# Patient Record
Sex: Female | Born: 1985
Health system: Southern US, Community
[De-identification: ages and names within clinical notes are randomized; demographics above are authoritative.]

## PROBLEM LIST (undated history)

## (undated) DIAGNOSIS — E119 Type 2 diabetes mellitus without complications: Secondary | ICD-10-CM

## (undated) DIAGNOSIS — F909 Attention-deficit hyperactivity disorder, unspecified type: Secondary | ICD-10-CM

## (undated) DIAGNOSIS — G4700 Insomnia, unspecified: Secondary | ICD-10-CM

## (undated) DIAGNOSIS — I10 Essential (primary) hypertension: Secondary | ICD-10-CM

## (undated) DIAGNOSIS — F419 Anxiety disorder, unspecified: Secondary | ICD-10-CM

## (undated) DIAGNOSIS — R519 Headache, unspecified: Secondary | ICD-10-CM

## (undated) DIAGNOSIS — F32A Depression, unspecified: Secondary | ICD-10-CM

## (undated) DIAGNOSIS — F319 Bipolar disorder, unspecified: Secondary | ICD-10-CM

## (undated) DIAGNOSIS — F4310 Post-traumatic stress disorder, unspecified: Secondary | ICD-10-CM

## (undated) HISTORY — DX: Type 2 diabetes mellitus without complications: E11.9

## (undated) HISTORY — DX: Headache, unspecified: R51.9

## (undated) HISTORY — DX: Post-traumatic stress disorder, unspecified: F43.10

## (undated) HISTORY — DX: Bipolar disorder, unspecified: F31.9

---

## 2019-09-04 HISTORY — PX: TONSILLECTOMY: SUR1361

## 2020-03-15 DIAGNOSIS — Z03818 Encounter for observation for suspected exposure to other biological agents ruled out: Secondary | ICD-10-CM | POA: Diagnosis not present

## 2020-03-15 DIAGNOSIS — Z20822 Contact with and (suspected) exposure to covid-19: Secondary | ICD-10-CM | POA: Diagnosis not present

## 2020-03-26 DIAGNOSIS — Z20822 Contact with and (suspected) exposure to covid-19: Secondary | ICD-10-CM | POA: Diagnosis not present

## 2020-03-31 ENCOUNTER — Encounter (HOSPITAL_BASED_OUTPATIENT_CLINIC_OR_DEPARTMENT_OTHER): Payer: Self-pay

## 2020-03-31 ENCOUNTER — Other Ambulatory Visit: Payer: Self-pay

## 2020-03-31 DIAGNOSIS — R05 Cough: Secondary | ICD-10-CM | POA: Diagnosis not present

## 2020-03-31 DIAGNOSIS — I10 Essential (primary) hypertension: Secondary | ICD-10-CM | POA: Diagnosis not present

## 2020-03-31 DIAGNOSIS — J020 Streptococcal pharyngitis: Secondary | ICD-10-CM | POA: Diagnosis not present

## 2020-03-31 DIAGNOSIS — J039 Acute tonsillitis, unspecified: Secondary | ICD-10-CM | POA: Insufficient documentation

## 2020-03-31 NOTE — ED Triage Notes (Signed)
Pt c/o flu like sx x 1 week-neg covid test 7/24-did receive covid vaccine-NAD-steady gait

## 2020-04-01 ENCOUNTER — Emergency Department (HOSPITAL_BASED_OUTPATIENT_CLINIC_OR_DEPARTMENT_OTHER)
Admission: EM | Admit: 2020-04-01 | Discharge: 2020-04-01 | Disposition: A | Discharge status: Home / Self Care | Payer: BC Managed Care – PPO | Attending: Emergency Medicine | Admitting: Emergency Medicine

## 2020-04-01 DIAGNOSIS — J020 Streptococcal pharyngitis: Secondary | ICD-10-CM

## 2020-04-01 DIAGNOSIS — J039 Acute tonsillitis, unspecified: Secondary | ICD-10-CM

## 2020-04-01 HISTORY — DX: Attention-deficit hyperactivity disorder, unspecified type: F90.9

## 2020-04-01 HISTORY — DX: Insomnia, unspecified: G47.00

## 2020-04-01 HISTORY — DX: Essential (primary) hypertension: I10

## 2020-04-01 HISTORY — DX: Depression, unspecified: F32.A

## 2020-04-01 HISTORY — DX: Anxiety disorder, unspecified: F41.9

## 2020-04-01 LAB — GROUP A STREP BY PCR: Group A Strep by PCR: DETECTED — AB

## 2020-04-01 MED ORDER — PENICILLIN G BENZATHINE 1200000 UNIT/2ML IM SUSP
1.2000 10*6.[IU] | Freq: Once | INTRAMUSCULAR | Status: AC
  Administered 2020-04-01: 1.2 10*6.[IU] via INTRAMUSCULAR
  Filled 2020-04-01: qty 2

## 2020-04-01 MED ORDER — DEXAMETHASONE 6 MG PO TABS
10.0000 mg | ORAL_TABLET | Freq: Once | ORAL | Status: AC
  Administered 2020-04-01: 10 mg via ORAL
  Filled 2020-04-01: qty 1

## 2020-04-01 NOTE — ED Provider Notes (Addendum)
[facility] Provider Note: Savannah Dell, MD, FACEP  CSN: [id number] MRN: [id number] ARRIVAL: 03/31/20 at 2235 ROOM: MHOTF/OTF   CHIEF COMPLAINT  Sore Throat   HISTORY OF PRESENT ILLNESS  04/01/20 1:21 AM Theo Reither is a 34 y.o. female with a 1 week history of sore throat and enlarged tonsils.  She rates her associated pain is a 4 out of 10, worse with swallowing.  She tested negative for Covid 03/26/2020 and has received the Covid vaccine series.  She has had an occasional cough.  She is not aware of having a fever.   Past Medical History:  Diagnosis Date  . ADHD   . Anxiety   . Depression   . Hypertension   . Insomnia     History reviewed. No pertinent surgical history.  No family history on file.  Social History   Tobacco Use  . Smoking status: Never Smoker  . Smokeless tobacco: Never Used  Vaping Use  . Vaping Use: Never used  Substance Use Topics  . Alcohol use: Yes    Comment: weekly  . Drug use: Yes    Types: Marijuana    Prior to Admission medications   Not on File    Allergies Patient has no known allergies.   REVIEW OF SYSTEMS  Negative except as noted here or in the History of Present Illness.   PHYSICAL EXAMINATION  Initial Vital Signs Blood pressure 123/75, pulse 90, temperature 98.6 F (37 C), temperature source Oral, resp. rate 18, height 5\' 1"  (1.549 m), weight (!) 98 kg, SpO2 98 %.  Examination General: Well-developed, well-nourished female in no acute distress; appearance consistent with age of record HENT: normocephalic; atraumatic; enlarged tonsils with exudate; uvula midline; no trismus Eyes: pupils equal, round and reactive to light; extraocular muscles intact Neck: supple; anterior cervical lymphadenopathy Heart: regular rate and rhythm Lungs: clear to auscultation bilaterally Abdomen: soft; nondistended; nontender; bowel sounds present Extremities: No deformity; full range of motion; pulses  normal Neurologic: Awake, alert and oriented; motor function intact in all extremities and symmetric; no facial droop Skin: Warm and dry Psychiatric: Normal mood and affect   RESULTS  Summary of this visit's results, reviewed and interpreted by myself:   EKG Interpretation  Date/Time:    Ventricular Rate:    PR Interval:    QRS Duration:   QT Interval:    QTC Calculation:   R Axis:     Text Interpretation:        Laboratory Studies: Results for orders placed or performed during the hospital encounter of 04/01/20 (from the past 24 hour(s))  Group A Strep by PCR     Status: Abnormal   Collection Time: 04/01/20  1:21 AM   Specimen: Throat; Sterile Swab  Result Value Ref Range   Group A Strep by PCR DETECTED (A) NOT DETECTED   Imaging Studies: No results found.  ED COURSE and MDM  Nursing notes, initial and subsequent vitals signs, including pulse oximetry, reviewed and interpreted by myself.  Vitals:   03/31/20 2243 04/01/20 0134  BP: 123/75 109/74  Pulse: 90 82  Resp: 18 14  Temp: 98.6 F (37 C) 98 F (36.7 C)  TempSrc: Oral Oral  SpO2: 98% 99%  Weight: (!) 98 kg   Height: 5\' 1"  (1.549 m)    Medications  penicillin g benzathine (BICILLIN LA) 1200000 UNIT/2ML injection 1.2 Million Units (1.2 Million Units Intramuscular Given 04/01/20 0140)  dexamethasone (DECADRON) tablet 10 mg (10 mg Oral Given  04/01/20 0142)   1:33 AM Patient would like to be treated for tonsillitis whether strep is positive or negative and I am in agreement with this plan.  Her tonsils are significantly enlarged with exudate.  We will also give a dose of dexamethasone.   PROCEDURES  Procedures   ED DIAGNOSES     ICD-10-CM   1. Tonsillitis  J03.90   2. Strep pharyngitis  J02.0        Irelynn Schermerhorn, MD 04/01/20 0134    Paula Libra, MD 04/01/20 0109

## 2020-04-08 DIAGNOSIS — E1169 Type 2 diabetes mellitus with other specified complication: Secondary | ICD-10-CM | POA: Diagnosis not present

## 2020-04-08 DIAGNOSIS — J351 Hypertrophy of tonsils: Secondary | ICD-10-CM | POA: Diagnosis not present

## 2020-04-21 DIAGNOSIS — J358 Other chronic diseases of tonsils and adenoids: Secondary | ICD-10-CM | POA: Diagnosis not present

## 2020-04-29 DIAGNOSIS — J358 Other chronic diseases of tonsils and adenoids: Secondary | ICD-10-CM | POA: Diagnosis not present

## 2020-04-29 DIAGNOSIS — J351 Hypertrophy of tonsils: Secondary | ICD-10-CM | POA: Diagnosis not present

## 2020-05-11 DIAGNOSIS — Z20822 Contact with and (suspected) exposure to covid-19: Secondary | ICD-10-CM | POA: Diagnosis not present

## 2020-12-06 ENCOUNTER — Other Ambulatory Visit: Payer: Self-pay

## 2020-12-06 ENCOUNTER — Emergency Department (HOSPITAL_BASED_OUTPATIENT_CLINIC_OR_DEPARTMENT_OTHER): Payer: No Typology Code available for payment source

## 2020-12-06 ENCOUNTER — Encounter (HOSPITAL_BASED_OUTPATIENT_CLINIC_OR_DEPARTMENT_OTHER): Payer: Self-pay | Admitting: *Deleted

## 2020-12-06 ENCOUNTER — Emergency Department (HOSPITAL_BASED_OUTPATIENT_CLINIC_OR_DEPARTMENT_OTHER)
Admission: EM | Admit: 2020-12-06 | Discharge: 2020-12-07 | Disposition: A | Discharge status: Home / Self Care | Payer: No Typology Code available for payment source | Attending: Emergency Medicine | Admitting: Emergency Medicine

## 2020-12-06 DIAGNOSIS — I10 Essential (primary) hypertension: Secondary | ICD-10-CM | POA: Insufficient documentation

## 2020-12-06 DIAGNOSIS — K5900 Constipation, unspecified: Secondary | ICD-10-CM | POA: Diagnosis not present

## 2020-12-06 DIAGNOSIS — Z79899 Other long term (current) drug therapy: Secondary | ICD-10-CM | POA: Insufficient documentation

## 2020-12-06 LAB — URINALYSIS, ROUTINE W REFLEX MICROSCOPIC
Bilirubin Urine: NEGATIVE
Glucose, UA: NEGATIVE mg/dL
Hgb urine dipstick: NEGATIVE
Ketones, ur: NEGATIVE mg/dL
Leukocytes,Ua: NEGATIVE
Nitrite: NEGATIVE
Protein, ur: NEGATIVE mg/dL
Specific Gravity, Urine: 1.02 (ref 1.005–1.030)
pH: 6.5 (ref 5.0–8.0)

## 2020-12-06 LAB — COMPREHENSIVE METABOLIC PANEL
ALT: 11 U/L (ref 0–44)
AST: 13 U/L — ABNORMAL LOW (ref 15–41)
Albumin: 3.9 g/dL (ref 3.5–5.0)
Alkaline Phosphatase: 72 U/L (ref 38–126)
Anion gap: 9 (ref 5–15)
BUN: 14 mg/dL (ref 6–20)
CO2: 25 mmol/L (ref 22–32)
Calcium: 8.7 mg/dL — ABNORMAL LOW (ref 8.9–10.3)
Chloride: 101 mmol/L (ref 98–111)
Creatinine, Ser: 1.02 mg/dL — ABNORMAL HIGH (ref 0.44–1.00)
GFR, Estimated: >60 mL/min (ref >60)
Glucose, Bld: 107 mg/dL — ABNORMAL HIGH (ref 70–99)
Potassium: 3.1 mmol/L — ABNORMAL LOW (ref 3.5–5.1)
Sodium: 135 mmol/L (ref 135–145)
Total Bilirubin: 0.3 mg/dL (ref 0.3–1.2)
Total Protein: 7.6 g/dL (ref 6.5–8.1)

## 2020-12-06 LAB — CBC
HCT: 37.7 % (ref 36.0–46.0)
Hemoglobin: 12.1 g/dL (ref 12.0–15.0)
MCH: 27.4 pg (ref 26.0–34.0)
MCHC: 32.1 g/dL (ref 30.0–36.0)
MCV: 85.3 fL (ref 80.0–100.0)
Platelets: 437 10*3/uL — ABNORMAL HIGH (ref 150–400)
RBC: 4.42 MIL/uL (ref 3.87–5.11)
RDW: 16.1 % — ABNORMAL HIGH (ref 11.5–15.5)
WBC: 9.9 10*3/uL (ref 4.0–10.5)
nRBC: 0 % (ref 0.0–0.2)

## 2020-12-06 LAB — PREGNANCY, URINE: Preg Test, Ur: NEGATIVE

## 2020-12-06 MED ORDER — BISACODYL 10 MG RE SUPP
10.0000 mg | Freq: Once | RECTAL | Status: AC
  Administered 2020-12-06: 10 mg via RECTAL
  Filled 2020-12-06: qty 1

## 2020-12-06 NOTE — ED Triage Notes (Signed)
No BM in 3 days since she started taking Zofran. She has tried increasing her fiber intake with no relief.

## 2020-12-06 NOTE — ED Provider Notes (Signed)
[facility] EMERGENCY DEPARTMENT Provider Note   CSN: [id number] Arrival date & time: 12/06/20  2043   History Chief Complaint  Patient presents with  . Constipation    Savannah [name] is a 35 y.o. female.  The history is provided by the patient.  Constipation She has history of hypertension, anxiety, depression and comes in because of constipation.  She states she has not had a bowel movement for the last 4 days.  She has had some abdominal cramping with that.  Abdominal pain has been as severe as 7/10.  There has been [name] nausea or vomiting.  She had taken ondansetron prior to getting constipated, but denies any other medication changes.  She has tried eating some fruits and taking a probiotic, with [name] relief.  Past Medical History:  Diagnosis Date  . ADHD   . Anxiety   . Depression   . Hypertension   . Insomnia     There are [name] problems to display for this patient.   History reviewed. [name] pertinent surgical history.   OB History   [name] obstetric history on file.     [name] family history on file.  Social History   Tobacco Use  . Smoking status: Never Smoker  . Smokeless tobacco: Never Used  Vaping Use  . Vaping Use: Never used  Substance Use Topics  . Alcohol use: Yes    Comment: weekly  . Drug use: Yes    Types: Marijuana    Home Medications Prior to Admission medications   Medication Sig Start Date End Date Taking? Authorizing Provider  amphetamine-dextroamphetamine (ADDERALL XR) 20 MG 24 hr capsule 10 mg. 03/04/20  Yes [provider]  Dulaglutide (TRULICITY) 0.75 MG/0.5ML SOPN See admin instructions. 04/26/20  Yes [provider]  escitalopram (LEXAPRO) 20 MG tablet 1 tablet   Yes [provider]  levonorgestrel (MIRENA, 52 MG,) 20 MCG/24HR IUD    Yes [provider]  lisinopril-hydrochlorothiazide (ZESTORETIC) 20-12.5 MG tablet 1 tablet 05/25/19  Yes [provider]  rizatriptan (MAXALT-MLT) 10 MG  disintegrating tablet 1 tablet 07/17/19  Yes [provider]  topiramate (TOPAMAX) 25 MG tablet 1 tablet 10/22/19  Yes [provider]  ALPRAZolam Prudy Feeler) 0.5 MG tablet 1 tablet 03/30/19   [provider]  Eszopiclone 3 MG TABS 1 tablet immediately before bedtime 03/30/19   [provider]  traZODone (DESYREL) 50 MG tablet Take by mouth.    [provider]    Allergies    Patient has [name] known allergies.  Review of Systems   Review of Systems  Gastrointestinal: Positive for constipation.  All other systems reviewed and are negative.   Physical Exam Updated Vital Signs BP 102/61 (BP Location: Right Arm)   Pulse 74   Temp 98.6 F (37 C) (Oral)   Resp 18   Ht 5\' 1"  (1.549 m)   Wt 89.5 kg   SpO2 100%   BMI 37.28 kg/m   Physical Exam Vitals and nursing note reviewed.   35 year old female, resting comfortably and in [name] acute distress. Vital signs are normal. Oxygen saturation is 100%, which is normal. Head is normocephalic and atraumatic. PERRLA, EOMI. Oropharynx is clear. Neck is nontender and supple without adenopathy or JVD. Back is nontender and there is [name] CVA tenderness. Lungs are clear without rales, wheezes, or rhonchi. Chest is nontender. Heart has regular rate and rhythm without murmur. Abdomen is soft, flat, nontender without masses or hepatosplenomegaly and peristalsis is normoactive. Rectal:  Normal sphincter tone, [name] impaction.  Small amount of light brown stool present. Extremities have [name] cyanosis or edema, full range of motion is present. Skin is warm and dry without rash. Neurologic: Mental status is normal, cranial nerves are intact, there are [name] motor or sensory deficits.  ED Results / Procedures / Treatments   Labs (all labs ordered are listed, but only abnormal results are displayed) Labs Reviewed  CBC - Abnormal; Notable for the following components:      Result Value   RDW 16.1 (*)    Platelets 437 (*)     All other components within normal limits  COMPREHENSIVE METABOLIC PANEL - Abnormal; Notable for the following components:   Potassium 3.1 (*)    Glucose, Bld 107 (*)    Creatinine, Ser 1.02 (*)    Calcium 8.7 (*)    AST 13 (*)    All other components within normal limits  URINALYSIS, ROUTINE W REFLEX MICROSCOPIC  PREGNANCY, URINE   Radiology [name] results found.  Procedures Procedures   Medications Ordered in ED Medications  magnesium citrate solution 1 Bottle (has [name] administration in time range)  potassium chloride SA (KLOR-CON) CR tablet 40 mEq (has [name] administration in time range)  bisacodyl (DULCOLAX) suppository 10 mg (10 mg Rectal Given 12/06/20 2344)    ED Course  I have reviewed the triage vital signs and the nursing notes.  Pertinent labs & imaging results that were available during my care of the patient were reviewed by me and considered in my medical decision making (see chart for details).  MDM Rules/Calculators/A&P Constipation without evidence of fecal impaction.  Old records reviewed, and she has [name] relevant past visits.  Exam is benign.  We will try bisacodyl suppository.  Labs show mild thrombocytosis, hypokalemia.  X-ray shows nonspecific bowel gas pattern.  She is given a dose of oral potassium.  Patient did not have a bowel movement following bisacodyl suppository.  She is given a dose of magnesium citrate and discharged.  Final Clinical Impression(s) / ED Diagnoses Final diagnoses:  Constipation, unspecified constipation type    Rx / DC Orders ED Discharge Orders    None       Dione Booze, MD 12/07/20 [id number]

## 2020-12-06 NOTE — ED Triage Notes (Signed)
Emergency Medicine Provider Triage Evaluation Note  Savannah [name] , a 35 y.o. female  was evaluated in triage.  Pt complains of constipation. No BM since 31st. Abdominal pain that began last night. No Rectal bleeding or anal pain. No history of IBS.  No abdominal surgeries. Feels bloated and full. No urinary symptoms.  Tried fiber.  Review of Systems  Positive: Constipation, abdominal pain  Negative: Fevers, bloody stool, anal pain  Physical Exam  BP 106/71 (BP Location: Right Arm)   Pulse (!) 103   Temp 98.6 F (37 C) (Oral)   Resp 16   Ht 5\' 1"  (1.549 m)   Wt 89.5 kg   SpO2 100%   BMI 37.28 kg/m  Gen:   Awake, no distress  HEENT:  Atraumatic  Resp:  Normal effort  Cardiac:  Normal rate  Abd:   Nondistended, nontender MSK:   Moves extremities without difficulty Neuro:  Speech clear   Medical Decision Making  Medically screening exam initiated at 9:30 PM.  Appropriate orders placed.  Savannah [name] was informed that the remainder of the evaluation will be completed by another provider, this initial triage assessment does not replace that evaluation, and the importance of remaining in the ED until their evaluation is complete.  Clinical Impression  Constipation with mild abdominal discomfort, no TTP on exam.   [name], PA-C 12/06/20 2136

## 2020-12-07 MED ORDER — MAGNESIUM CITRATE PO SOLN
1.0000 | Freq: Once | ORAL | Status: AC
  Administered 2020-12-07: 1 via ORAL
  Filled 2020-12-07: qty 296

## 2020-12-07 MED ORDER — POTASSIUM CHLORIDE CRYS ER 20 MEQ PO TBCR
40.0000 meq | EXTENDED_RELEASE_TABLET | Freq: Once | ORAL | Status: AC
  Administered 2020-12-07: 40 meq via ORAL
  Filled 2020-12-07: qty 2

## 2020-12-07 NOTE — Discharge Instructions (Signed)
Try to stay on a high-fiber diet.  It is acceptable to take polyethylene glycol (MiraLAX) as needed for constipation.  MiraLAX can be safely taken every day, if needed.  For times when you need a laxative, it is safe to use bisacodyl on an occasional basis.

## 2020-12-09 ENCOUNTER — Ambulatory Visit (HOSPITAL_COMMUNITY)
Admission: EM | Admit: 2020-12-09 | Discharge: 2020-12-09 | Disposition: A | Discharge status: Home / Self Care | Payer: 59

## 2020-12-09 ENCOUNTER — Other Ambulatory Visit: Payer: Self-pay

## 2020-12-09 DIAGNOSIS — F3130 Bipolar disorder, current episode depressed, mild or moderate severity, unspecified: Secondary | ICD-10-CM

## 2020-12-09 NOTE — ED Provider Notes (Signed)
Behavioral Health Urgent Care Medical Screening Exam  Patient Name: Savannah [name] MRN: [id number] Date of Evaluation: 12/09/20 Chief Complaint:  Crying spells Diagnosis:  Final diagnoses:  Bipolar I disorder, most recent episode depressed (HCC)    History of Present illness: Savannah [name] is a 35 y.o. female with past psychiatric diagnosis of major depressive disorder, generalized anxiety disorder and informed the provider that she has been recently diagnosed with bipolar in February 2020.   She denies any suicidal or homicidal ideations today.  She denies any auditory or visual hallucinations.  She states that [facility] but was told this morning that she has an outstanding bill~1000 $ there and until she settles that they will not be able to see her.  Apparently, they do not have her insurance information and were charging her as a cash patient.  She states she was started on Lamictal in February 2020 for mood stabilization but she does not feel well from last 2 weeks.  She is having crying spells, getting stuck at her work, decreased motivation for work but is afraid about losing her job.  She was taking Lexapro from 2016 to February 2022 and is being tapered off Lexapro and only taking 10 mg daily.  She states she needs help with outpatient psychiatrist and therapist as she would not be able [facility].  She mentioned about her past suicide attempt in 2018 and she was admitted in inpatient psychiatric hospital, [place].  She did PHP after that was life-changing for her and she plans to do outpatient therapy again. 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  Psychiatric Specialty Exam  Presentation  General Appearance:Well Groomed  Eye Contact:Good  Speech:Pressured  Speech  Volume:Normal  Handedness:Right   Mood and Affect  Mood:Anxious  Affect:Tearful   Thought Process  Thought Processes:Coherent  Descriptions of Associations:Intact  Orientation:Full (Time, Place and Person)  Thought Content:Abstract Reasoning    Hallucinations:None  Ideas of Reference:None  Suicidal Thoughts:No  Homicidal Thoughts:No   Sensorium  Memory:Immediate Fair; Recent Fair; Remote Fair  Judgment:Good  Insight:Good   Executive Functions  Concentration:Good  Attention Span:Good  Recall:Fair  Fund of Knowledge:Good  Language:Good   Psychomotor Activity  Psychomotor Activity:Normal   Assets  Assets:Communication Skills; Desire for Improvement; Financial Resources/Insurance; Housing; Resilience; Social Support; Talents/Skills; Transportation   Sleep  Sleep:Fair  Number of hours: 5   Nutritional Assessment (For OBS and FBC admissions only) Has the patient had a weight loss or gain of 10 pounds or more in the last 3 months?: No Has the patient had a decrease in food intake/or appetite?: Yes Does the patient have dental problems?: No Does the patient have eating habits or behaviors that may be indicators of an eating disorder including binging or inducing vomiting?: No Has the patient recently lost weight without trying?: No Has the patient been eating poorly because of a decreased appetite?: Yes Malnutrition Screening Tool Score: 1    Physical Exam: Physical Exam Vitals and nursing note reviewed.  Constitutional:      Appearance: She is obese.  HENT:     Head: Normocephalic and atraumatic.     Nose: Nose normal.     Mouth/Throat:     Mouth: Mucous membranes are moist.  Cardiovascular:     Rate and Rhythm: Normal rate and regular rhythm.  Pulses: Normal pulses.  Pulmonary:     Effort: Pulmonary effort is normal.  Skin:    General: Skin is warm.  Neurological:     Mental Status: She is alert and oriented to person, place, and  time.    Review of Systems  Constitutional: Negative.   HENT: Negative.   Eyes: Negative.   Respiratory: Negative.   Cardiovascular: Negative.   Gastrointestinal: Negative.   Genitourinary: Negative.   Musculoskeletal: Negative.   Skin: Negative.    Blood pressure 112/70, pulse 77, temperature 98 F (36.7 C), temperature source Oral, resp. rate 18, SpO2 100 %. There is no height or weight on file to calculate BMI.  Musculoskeletal: Strength & Muscle Tone: within normal limits Gait & Station: normal Patient leans: N/A   [facility] MSE Discharge Disposition for Follow up and Recommendations: Based on my evaluation the patient does not appear to have an emergency medical condition and can be discharged with resources and follow up care in outpatient services for Medication Management and Individual Therapy --Patient is provided with appropriate resources to schedule appointment for outpatient psychiatrist and therapist.  Initially patient showed good understanding about it but later on was tearful as she was feeling overwhelmed about apprehension of making these appointments.  She is still not suicidal, homicidal, psychotic or manic and does not meet criteria for inpatient psychiatric hospitalization. --Patient is explained in detail that she should be calling and making an appointment for her for outpatient services for continuity of her treatment.  Arnoldo Lenis, MD 12/09/2020, 1:10 PM  PGY-1, Resident

## 2020-12-09 NOTE — [facility] Counselor (Signed)
TTS triage: Patient presents to [facility] reporting concerns over current medications. She is followed by the [facility] and was started on lamictdal 14 weeks ago. 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 She is concerned about losing her job. She is tearful as she speaks about this. She went to [facility] today to see if she could move up her next appointment, but they did not have her insurance on file and said she had a $1,000 bill and would not see her.   Patient is routine.

## 2020-12-09 NOTE — ED Notes (Signed)
Pt discharged with resources in hand. Verbalized understanding of discharge instructions. Safety maintained. 

## 2020-12-09 NOTE — Discharge Instructions (Addendum)

## 2021-01-15 ENCOUNTER — Ambulatory Visit (HOSPITAL_COMMUNITY)
Admission: EM | Admit: 2021-01-15 | Discharge: 2021-01-15 | Disposition: A | Discharge status: Home / Self Care | Payer: 59

## 2021-01-15 ENCOUNTER — Other Ambulatory Visit: Payer: Self-pay

## 2021-01-15 DIAGNOSIS — F320 Major depressive disorder, single episode, mild: Secondary | ICD-10-CM

## 2021-01-15 NOTE — ED Notes (Signed)
Pt discharged with recommendations for follow up care. Safety maintained.

## 2021-01-15 NOTE — Discharge Instructions (Signed)
Take all medications as prescribed. Keep all follow-up appointments as scheduled.  Do not consume alcohol or use illegal drugs while on prescription medications. Report any adverse effects from your medications to your primary care provider promptly.  In the event of recurrent symptoms or worsening symptoms, call 911, a crisis hotline, or go to the nearest emergency department for evaluation.   

## 2021-01-15 NOTE — Progress Notes (Signed)
Patient presents as a walk-in to the [facility] stating that she feels like she has lost her "T-Code."  She states that she needs to be inside of someone to let it all out.  Patientstates that she feels like she needs a full psychiatric evaluation.  Patient states that she was ADHD as a child and now as an adult, she states that he has been being treated for bipolar disorder.  She states that she has been going to the [facility] and they have been treating her with mood stabilizers, but she states that she does not believe that this was the right kind of medication.  Patient states that she believes that she has autism and aspergers.  Patient states that he has sleep disturbance and she has been experiencing nightmares and she states that she has a lot of anxiety.  She denies any current SI/HI/Psychosis.  Patient states that she has been suicidal in the past and states that she was last hospitalized for this in [place] in 2018.  Patient admits to daily marijuana use.  Patient is agreeable to participate in IOP at [facility].  Patient was seen by [name], Np and arrangements were made for her to start the program this week.

## 2021-01-15 NOTE — ED Provider Notes (Addendum)
Behavioral Health Urgent Care Medical Screening Exam  Patient Name: Savannah [name] MRN: [id number] Date of Evaluation: 01/15/21 Chief Complaint:   Diagnosis:  Final diagnoses:  None    Savannah [name]  reports " I feel like I have unlocked my cheat codes."    History of Present illness: Savannah [name] is a 35 y.o. female presents to [facility] urgent care seeking a full evaluation of her mental health.  Stated "I think I may have been on the autistic spectrum/Asperger".  Reports [facility] with a diagnosis of [facility] disorder.  States she was prescribed  a mood stabilizer which made her depression worse.    States she was diagnosed with ADHD during her childhood.  Current diagnoses major depressive disorder, anxiety and insomnia.  States she is recently changing providers to [facility].  Reported suicide attempt in 2018 where she completed a partial hospitalization program in [place].  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   Deziah denied suicidal or homicidal ideations.  Denies auditory or visual hallucinations.  Reports she is currently prescribed Xanax, Adderall and Lexapro.  States she continues to have nightmares related to " bad calls" reports conversations ruminate in her mind.  Discussed following up with partial hospitalization programming.  Support, encouragement and reassurance was provided.  Psychiatric Specialty Exam  Presentation  General Appearance:Appropriate for Environment  Eye Contact:Good  Speech:Clear and Coherent  Speech Volume:Normal  Handedness:Right   Mood and Affect  Mood:Anxious  Affect:Congruent   Thought Process  Thought Processes:Coherent  Descriptions of Associations:Intact  Orientation:Full (Time, Place and Person)  Thought Content:Logical    Hallucinations:None  Ideas of Reference:None  Suicidal Thoughts:No  Homicidal Thoughts:No   Sensorium   Memory:Immediate Fair; Recent Fair; Remote Fair  Judgment:Fair  Insight:Fair   Executive Functions  Concentration:Fair  Attention Span:Fair  Recall:Fair  Fund of Knowledge:Good  Language:Good   Psychomotor Activity  Psychomotor Activity:Normal   Assets  Assets:Desire for Improvement; Social Support   Sleep  Sleep:Poor  Number of hours: 5   Nutritional Assessment (For OBS and FBC admissions only) Has the patient had a weight loss or gain of 10 pounds or more in the last 3 months?: No Has the patient had a decrease in food intake/or appetite?: No Does the patient have dental problems?: No Does the patient have eating habits or behaviors that may be indicators of an eating disorder including binging or inducing vomiting?: No Has the patient recently lost weight without trying?: No Has the patient been eating poorly because of a decreased appetite?: No Malnutrition Screening Tool Score: 0    Physical Exam: Physical Exam Vitals and nursing note reviewed.  Cardiovascular:     Rate and Rhythm: Normal rate and regular rhythm.  Neurological:     Mental Status: She is alert.  Psychiatric:        Attention and Perception: Attention normal.        Mood and Affect: Mood normal.        Speech: Speech normal.        Behavior: Behavior normal.        Thought Content: Thought content normal.        Cognition and Memory: Cognition normal.        Judgment: Judgment normal.    Review of Systems  Psychiatric/Behavioral: Positive for depression. Negative for hallucinations and suicidal ideas. Substance abuse: reported THC use. The patient is nervous/anxious.   All other systems reviewed  and are negative.  There were no vitals taken for this visit. There is no height or weight on file to calculate BMI.  Musculoskeletal: Strength & Muscle Tone: within normal limits Gait & Station: normal Patient leans: N/A   [facility] MSE Discharge Disposition for Follow up and  Recommendations: Based on my evaluation the patient does not appear to have an emergency medical condition and can be discharged with resources and follow up care in outpatient services for Medication Management, Individual Therapy and Group Therapy - PHP follow-up  [name], NP 01/15/2021, 10:20 AM

## 2021-01-16 ENCOUNTER — Telehealth (HOSPITAL_COMMUNITY): Payer: Self-pay | Admitting: Licensed Clinical Social Worker

## 2021-01-17 ENCOUNTER — Other Ambulatory Visit: Payer: Self-pay

## 2021-01-17 ENCOUNTER — Ambulatory Visit (HOSPITAL_COMMUNITY): Payer: 59 | Admitting: Licensed Clinical Social Worker

## 2021-01-17 DIAGNOSIS — F3181 Bipolar II disorder: Secondary | ICD-10-CM

## 2021-01-18 ENCOUNTER — Other Ambulatory Visit (HOSPITAL_COMMUNITY): Payer: 59 | Attending: Family | Admitting: Licensed Clinical Social Worker

## 2021-01-18 ENCOUNTER — Encounter (HOSPITAL_COMMUNITY): Payer: Self-pay | Admitting: Family

## 2021-01-18 ENCOUNTER — Other Ambulatory Visit (HOSPITAL_COMMUNITY): Payer: 59 | Admitting: Occupational Therapy

## 2021-01-18 ENCOUNTER — Encounter (HOSPITAL_COMMUNITY): Payer: Self-pay

## 2021-01-18 ENCOUNTER — Other Ambulatory Visit: Payer: Self-pay

## 2021-01-18 DIAGNOSIS — R4589 Other symptoms and signs involving emotional state: Secondary | ICD-10-CM

## 2021-01-18 DIAGNOSIS — R41844 Frontal lobe and executive function deficit: Secondary | ICD-10-CM

## 2021-01-18 DIAGNOSIS — F3181 Bipolar II disorder: Secondary | ICD-10-CM | POA: Diagnosis not present

## 2021-01-18 DIAGNOSIS — Z79899 Other long term (current) drug therapy: Secondary | ICD-10-CM | POA: Insufficient documentation

## 2021-01-18 DIAGNOSIS — F331 Major depressive disorder, recurrent, moderate: Secondary | ICD-10-CM

## 2021-01-18 MED ORDER — QUETIAPINE FUMARATE 25 MG PO TABS: 25.0000 mg | ORAL_TABLET | Freq: Two times a day (BID) | ORAL | 0 refills | Status: DC

## 2021-01-18 MED ORDER — QUETIAPINE FUMARATE 100 MG PO TABS: 100.0000 mg | ORAL_TABLET | Freq: Every day | ORAL | 0 refills | Status: DC

## 2021-01-18 MED ORDER — PRAZOSIN HCL 1 MG PO CAPS: 1.0000 mg | ORAL_CAPSULE | Freq: Every day | ORAL | 0 refills | Status: DC

## 2021-01-18 NOTE — Psych (Signed)
Virtual Visit via Video Note  I connected with Savannah [name] on 01/18/21 at  9:00 AM EDT by a video enabled telemedicine application and verified that I am speaking with the correct person using two identifiers.  Location: Patient: patient home Provider: clinical home office   I discussed the limitations of evaluation and management by telemedicine and the availability of in person appointments. The patient expressed understanding and agreed to proceed.  I discussed the assessment and treatment plan with the patient. The patient was provided an opportunity to ask questions and all were answered. The patient agreed with the plan and demonstrated an understanding of the instructions.   The patient was advised to call back or seek an in-person evaluation if the symptoms worsen or if the condition fails to improve as anticipated.  Pt was provided 240 minutes of non-face-to-face time during this encounter.   [name], LCSW    [facility] PHP THERAPIST PROGRESS NOTE  Savannah [name] [id number]  Session Time: 9:00 - 10:00  Participation Level: Active  Behavioral Response: CasualAlertEuthymic  Type of Therapy: Group Therapy  Treatment Goals addressed: Coping  Interventions: Psychoeducation  Summary: Pharmacy group   Therapist Response: Pt engaged in discussion.         Session Time: 10:00 - 11:00   Participation Level: Active   Behavioral Response: CasualAlertDepressed   Type of Therapy: Group Therapy   Treatment Goals addressed: Coping   Interventions: CBT, DBT, Supportive and Reframing   Summary: Clinician led check-in regarding current stressors and situation. Clinician utilized active listening and empathetic response and validated patient emotions. Clinician facilitated processing group on pertinent issues.    Therapist Response: Savannah [name] is a 35 y.o. female who presents with mood symptoms. Patient arrived within time allowed and reports that  she is feeling "pretty good." Patient rates her mood at a 7 on a scale of 1-10 with 10 being great. Pt reports today is her birthday and she is excited about celebrating this evening. Pt reports some anxiety re: being social. Pt able to process. Pt engaged in discussion.       Session Time: 11:00 - 12:00    Participation Level: Active   Behavioral Response: CasualAlertDepressed   Type of Therapy: Group Therapy   Treatment Goals addressed: Coping   Interventions: Supportive, Reframing   Summary: Spiritual Care group   Therapist Response: Pt engaged in session. See chaplain note           Session Time: 12:00 -1:00   Participation Level: Active   Behavioral Response: CasualAlertDepressed   Type of Therapy: Group therapy   Treatment Goals addressed: Coping   Interventions: Psychosocial skills training, Supportive   Summary: 12:00 - 12:50: Occupational Therapy group 12:50 -1:00 Clinician led check-out. Clinician assessed for immediate needs, medication compliance and efficacy, and safety concerns   Therapist Response: 12:00 - 12:50: Patient engaged in group. See OT note.  12:50 - 1:00: At check-out, patient rates her mood at a 6 on a scale of 1-10 with 10 being great. Pt reports afternoon plans of getting out of her apartment. Pt demonstrates some progress as evidenced by participation in first group session. Patient denies SI/HI at the end of group.    Suicidal/Homicidal: Nowithout intent/plan  Plan: Pt will continue in PHP while working to increase emotional regulation and healthy coping ability.   Diagnosis: MDD (major depressive disorder), recurrent episode, moderate (HCC) [F33.1]    1. MDD (major depressive disorder), recurrent episode, moderate (HCC)   2. Bipolar  2 disorder (HCC)       [name], LCSW 01/18/2021

## 2021-01-18 NOTE — Progress Notes (Signed)
Spoke with patient via Webex video call, used 2 identifiers to correctly identify patient. States that she went to urgent care after feeling overwhelmed and was given PHP as recommendation. She takes things too seriously and is hard on herself. Has abandonment issues and PTSD. Had a suicide attempt in 2018 but states she never wants to feel that badly again. Today is her first day in PHP and she states its going great, she has already learned 2 new coping skills. Denies SI/HI or AV hallucinations. On scale 1-10 as 10 being worst she rates depression at 6 and anxiety at 6. No issues or complaints.

## 2021-01-18 NOTE — Psych (Signed)
Virtual Visit via Video Note  I connected with Savannah [name] on 01/18/21 at  9:00 AM EDT by a video enabled telemedicine application and verified that I am speaking with the correct person using two identifiers.  Location: Patient: patient home Provider: Clinical home office   I discussed the limitations of evaluation and management by telemedicine and the availability of in person appointments. The patient expressed understanding and agreed to proceed.  I discussed the assessment and treatment plan with the patient. The patient was provided an opportunity to ask questions and all were answered. The patient agreed with the plan and demonstrated an understanding of the instructions.   The patient was advised to call back or seek an in-person evaluation if the symptoms worsen or if the condition fails to improve as anticipated.  Pt and cln completed treatment plan and pt provides verbal alignment with plan. Pt states verbal consent to treatment and agreement with group commitments.   I provided 10 minutes of non-face-to-face time during this encounter.   [name], LCSW

## 2021-01-18 NOTE — Progress Notes (Signed)
Virtual Visit via Video Note  I connected with Savannah [name] on 01/18/21 at  9:00 AM EDT by a video enabled telemedicine application and verified that I am speaking with the correct person using two identifiers.  Location: Patient: Home Provider: Office    I discussed the limitations of evaluation and management by telemedicine and the availability of in person appointments. The patient expressed understanding and agreed to proceed.   I discussed the assessment and treatment plan with the patient. The patient was provided an opportunity to ask questions and all were answered. The patient agreed with the plan and demonstrated an understanding of the instructions.   The patient was advised to call back or seek an in-person evaluation if the symptoms worsen or if the condition fails to improve as anticipated.  I provided 15 minutes of non-face-to-face time during this encounter.   [name], NP    Behavioral Health Partial Program Assessment Note  Date: 01/18/2021 Name: Savannah [name] MRN: [id number]    [id number] Arciniega is a 35 y.o. Caucasian female presents with depression and anxiety. Patient refer from [facility]. See assessment below.Patient was enrolled in partial psychiatric program on 01/18/21.  Savannah [name] is a 35 y.o. female presents to [facility] urgent care seeking a full evaluation of her mental health.  Stated "I think I may have been on the autistic spectrum/Asperger".  Reports she [facility] with a diagnosis of bipolar disorder.  States she was prescribed  a mood stabilizer which made her depression worse.    States she was diagnosed with ADHD during her childhood.  Current diagnoses major depressive disorder, anxiety and insomnia.  States she is recently changing providers to [facility].  Reported suicide attempt in 2018 where she completed a partial hospitalization program in [place].  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   Savannah [name] denied suicidal or homicidal ideations.  Denies auditory or visual hallucinations.  Reports she is currently prescribed Xanax, Adderall and Lexapro.  States she continues to have nightmares related to " bad calls" reports conversations ruminate in her mind.  Discussed following up with partial hospitalization programming.  Support, encouragement and reassurance was provided  Primary complaints include: depression worse, increased irritability, poor concentration, problem with medication and stressed at work.  Onset of symptoms was gradual with gradually worsening course since that time. Psychosocial Stressors include the following: occupational.   I have reviewed the following documentation dated 01/18/2021: past psychiatric history, past medical history and past social and family history  Complaints of Pain: nonear Past Psychiatric History:  Past psychiatric hospitalizations , Previous suicide attempts  and Past medication trials   Currently in treatment with Trazodone, Lexapro and Xanax   Substance Abuse History: marijuana Use of Alcohol: denied Use of Caffeine: denies use Use of over the counter:   No past surgical history on file.  Past Medical History:  Diagnosis Date  . ADHD   . Anxiety   . Depression   . Hypertension   . Insomnia    Outpatient Encounter Medications as of 01/18/2021  Medication Sig  . prazosin (MINIPRESS) 1 MG capsule Take 1 capsule (1 mg total) by mouth at bedtime.  . ALPRAZolam (XANAX) 0.5 MG tablet 1 tablet  . amphetamine-dextroamphetamine (ADDERALL XR) 20 MG 24 hr capsule 10 mg.  . Dulaglutide (TRULICITY) 0.75 MG/0.5ML SOPN See admin instructions.  Marland Kitchen escitalopram (LEXAPRO) 20 MG tablet 1 tablet  . Eszopiclone 3 MG  TABS 1 tablet immediately before bedtime  . levonorgestrel (MIRENA, 52 MG,) 20 MCG/24HR IUD   . lisinopril-hydrochlorothiazide (ZESTORETIC) 20-12.5 MG tablet 1 tablet  . rizatriptan  (MAXALT-MLT) 10 MG disintegrating tablet 1 tablet  . topiramate (TOPAMAX) 25 MG tablet 1 tablet  . traZODone (DESYREL) 50 MG tablet Take by mouth.   No facility-administered encounter medications on file as of 01/18/2021.   No Known Allergies  Social History   Tobacco Use  . Smoking status: Never Smoker  . Smokeless tobacco: Never Used  Substance Use Topics  . Alcohol use: Yes    Comment: weekly   Functioning Relationships: good support system and gets along well with co-workers Education: Other (Specify any learning disability, behavioral disorder, special education needs, difficulty reading.):  Other Pertinent History: None No family history on file.   Review of Systems Constitutional: negative  Objective:  There were no vitals filed for this visit.  Physical Exam:   Mental Status Exam: Appearance:  Well groomed Psychomotor::  Within Normal Limits Attention span and concentration: Normal Behavior: calm, cooperative and adequate rapport can be established, hyper verbal  Speech:  normal pitch Mood:  depressed and anxious Affect:  normal Thought Process:  Coherent Thought Content:  WDL Orientation:  person and place Cognition:  grossly intact Insight:  Intact Judgment:  Intact Estimate of Intelligence: Average Fund of knowledge: Aware of current events Memory: Recent and remote intact Abnormal movements: None Gait and station: Normal  Assessment:  Diagnosis: MDD (major depressive disorder), recurrent episode, moderate (HCC) [F33.1] 1. MDD (major depressive disorder), recurrent episode, moderate (HCC)     Indications for admission: inpatient care required if not in partial hospital program  Plan: Orders placed for Occupational Therapy  patient enrolled in Partial Hospitalization Program, patient's current medications are to be continued, the following medications are being prescribed  Minipress 1mg  QHS  for Nightmares and Seroquel 25 mg BID and 100 mg  nightly for mood stablization, a comprehensive treatment plan will be developed and side effects of medications have been reviewed with patient.  Patient to consider Lamictal- taken in the past, Depakote or Lithium  ie mood stabilizing medication. She declined at this time.  Treatment options and alternatives reviewed with patient and patient understands the above plan. Treatment paln was reviewed and agreed upon by NP T. and patient Savannah [name] need for group service.       Dot Lanes, NP

## 2021-01-18 NOTE — Therapy (Signed)
[facility] [address] [place], [place], [address] Phone: [phone]   Fax:  [phone] Virtual Visit via Video Note  I connected with Savannah BuffKrista [name] on 01/18/21 at  12:00 PM EDT by a video enabled telemedicine application and verified that I am speaking with the correct person using two identifiers.  Location: Patient: Patient Home Provider: Clinic Office   I discussed the limitations of evaluation and management by telemedicine and the availability of in person appointments. The patient expressed understanding and agreed to proceed.   I discussed the assessment and treatment plan with the patient. The patient was provided an opportunity to ask questions and all were answered. The patient agreed with the plan and demonstrated an understanding of the instructions.   The patient was advised to call back or seek an in-person evaluation if the symptoms worsen or if the condition fails to improve as anticipated.  I provided 73 minutes of non-face-to-face time during this encounter. 50 minutes OT Group 23 minutes OT Evaluation  Savannah [name], OT   Occupational Therapy Evaluation  Patient Details  Name: Savannah [name] MRN: [id number] Date of Birth: [birth year] Referring Provider (OT): Hillery Jacksanika Lewis   Encounter Date: 01/18/2021   OT End of Session - 01/18/21 1540    Visit Number 1    Number of Visits 20    Date for OT Re-Evaluation 02/15/21    Authorization Type [employer]    OT Start Time 1200   OT Eval [phone]   OT Stop Time 1250    OT Time Calculation (min) 50 min    Activity Tolerance Patient tolerated treatment well    Behavior During Therapy [facility] for tasks assessed/performed           Past Medical History:  Diagnosis Date  . ADHD   . Anxiety   . Bipolar disorder (HCC)   . Depression   . Diabetes mellitus, type II (HCC)   . Headache   . Hypertension   . Insomnia   . PTSD (post-traumatic  stress disorder)     Past Surgical History:  Procedure Laterality Date  . TONSILLECTOMY Bilateral 2021    There were no vitals filed for this visit.   Subjective Assessment - 01/18/21 1535    Currently in Pain? No/denies             [facility] Assessment - 01/18/21 0001      Assessment   Medical Diagnosis Bipolar 2 Disorder    Referring Provider (OT) Hillery Jacksanika Lewis      Precautions   Precautions None      Balance Screen   Has the patient fallen in the past 6 months No    Has the patient had a decrease in activity level because of a fear of falling?  No    Is the patient reluctant to leave their home because of a fear of falling?  No           OT Education - 01/18/21 1536    Education Details Educated [facility] in addition to the 5 F's and provided resources/strategies/tips to improve overall health and wellness    Person(s) Educated Patient    Methods Explanation;Handout    Comprehension Verbalized understanding            OT Short Term Goals - 01/18/21 1550      OT SHORT TERM GOAL #1   Title Pt will actively engage in OT group  sessions throughout duration of PHP programming, in order to promote daily structure, social engagement, and opportunities to develop and utilize adaptive strategies to maximize functional performance in preparation for safe transition and integration back into school, work, and the community.    Time 4    Period Weeks    Status New    Target Date 02/15/21      OT SHORT TERM GOAL #2   Title Pt will demonstrate improved ability to communicate feelings/needs/wants, without being angry/irritable/aggressive/manic, as evidenced by, active participation in OT sessions, throughout duration of PHP programming, in order to safely transition back into the community at discharge.    Time 4    Period Weeks    Status New    Target Date 02/15/21      OT SHORT TERM GOAL #3   Title Pt will identify a minimum of two self-soothing  relaxation strategies she can utilize in the moment, to safely manage increased anxiety/PTSD flashbacks, without engaging in self-harm behaviors,  in preparation for safe community reintegration.    Time 4    Period Weeks    Status New    Target Date 02/15/21         Occupational Therapy Assessment 01/18/2021  Savannah [name] is 35 y/o female with PMHx of bipolar disorder, depression, anxiety, and self-reported PTSD who was referred to the [facility] program from the [facility] with reports of worsening mental health symptoms. Pt unable to identify any specific stressor, however appears hypomanic throughout OT evaluation with high energy. Pt reports "feelings of abandonment" and "PTSD". Pt works FT for a call center at [employer] and reports desire to engage in [facility] programming in order to manage identified stressors and to engage meaningfully in identified areas of occupation and ADL/iADLs. Pt reports enjoying painting, coloring, and crafts and identifies goal for admission "not have extreme PTSD and manage mood swings".   Precautions/Limitations: None noted  Cognition: Appears intact   Visual Motor: Pt reports wearing glasses; is not wearing them in virtual session, though later observed putting them on.    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 Pt reports her parents "don't understand my mental health"    What do you do when you are very stressed, angry, upset, sad or anxious? Isolate from others, Yell/Scream, Cry, Draw/color, Listen to music and Write in a journal   What helps when you are not  feeling well? Wrapping in a blanket, Deep breathing, Additional/Extra medication, Taking a shower or bath, Having a warm or cool drink, Reading, Drawing, Pacing the hall, Writing in a journal and Listening to music  What are some things that make it MORE difficult for you when you are already upset? Not having choices/input, Being alone/isolated, Not being able to express my opinion, Loud noises, People staring at me, Being criticized, Boredom/Lack of activities, Yelling and Particular time of year  Is there anything specific that you would like help with while you're in the partial hospitalization program? Coping Skills, Anger Management, Impulse Control, Relationships, Communication, Medication , Stress Management, Self-Harm Urges, Goal-setting, Sleep and Self-esteem   What is  your goal while you are here?  "Manage my mood swings"  Assessment: Pt demonstrates behavior that inhibits/restricts participation in occupation and would benefit from skilled occupational therapy services to address current difficulties with symptom management, emotion regulation, socialization, stress management, time management, job readiness, financial wellness, health and nutrition, sleep hygiene, ADL/iADL performance and leisure participation, in preparation for reintegration and return to community at discharge.   Plan: Pt will participate in skilled occupational therapy sessions (group and/or individual) in order to promote daily structure, social engagement, and opportunities to develop and utilize adaptive strategies to maximize functional performance in preparation for safe transition and integration back into school, work, and/or the community at discharge. OT sessions will occur 4-5 x per week for 2-4 weeks.   Savannah Hazel, MOT, OTR/L  Group Session:  S: None noted*  O: Today's group session focused on the topic of health and wellness as it relates to the impact on mental health. Discussion focused on  identifying the 5 F's to wellness including Food, Fitness, Fresh air, Fellowship, and Friendship with self and soul. Group members identified areas of wellness that they would like to improve upon and were educated and offered various resources. Discussion also focused on how the food we eat impacts our mental health, along with the benefits of engaging in physical activity/exercise and getting outside for fresh air. Discussion wrapped up with group members identifying one area of wellness they could improve upon and identified a strategy to do so.    A: Savannah [name] was active and independent in her participation of discussion and group activity, sharing several suggestions and strategies to other peers surrounding physical activity and nutrition. She appeared less hyperactive in group setting vs individual evaluation and offered several helpful contributions. She shared that one thing she likes to do for physical activity is swim and other activities that "don't feel like exercise." Receptive to additional strategies and education offered and shared.   P: Continue to attend PHP OT group sessions 5x week for 4 weeks to promote daily structure, social engagement, and opportunities to develop and utilize adaptive strategies to maximize functional performance in preparation for safe transition and integration back into school, work, and the community. Plan to address topic of stress management in next OT group session.  Plan - 01/18/21 1541    Clinical Impression Statement Aiyanah is 35 y/o female with PMHx of bipolar disorder, depression, anxiety, and self-reported PTSD who was referred to the [facility] program from the [facility] with reports of worsening mental health symptoms. Pt unable to identify any specific stressor, however appears hypomanic throughout OT evaluation with high energy. Pt reports "feelings of abandonment" and "PTSD". Pt works FT for a call center at [employer] and reports desire to engage in [facility]  programming in order to manage identified stressors and to engage meaningfully in identified areas of occupation and ADL/iADLs.    OT Occupational Profile and History Problem Focused Assessment - Including review of records relating to presenting problem    Occupational performance deficits (Please refer to evaluation for details): ADL's;IADL's;Rest and Sleep;Education;Work;Leisure;Social Participation    Body Structure / Function / Physical Skills ADL    Cognitive Skills Attention;Emotional;Energy/Drive;Learn;Memory;Perception;Problem Solve;Safety Awareness;Temperament/Personality;Thought;Understand    Psychosocial Skills Coping Strategies;Environmental  Adaptations;Habits;Interpersonal Interaction;Routines and Behaviors    Rehab Potential Good    Clinical Decision Making Limited treatment options, no task modification necessary    Comorbidities Affecting Occupational Performance: May have comorbidities impacting occupational performance    Modification or Assistance to Complete Evaluation  No modification of tasks or assist necessary to complete eval    OT Frequency 5x / week    OT Duration 4 weeks    OT Treatment/Interventions Self-care/ADL training;Patient/family education;Coping strategies training;Psychosocial skills training    Consulted and Agree with Plan of Care Patient           Patient will benefit from skilled therapeutic intervention in order to improve the following deficits and impairments:   Body Structure / Function / Physical Skills: ADL Cognitive Skills: Attention,Emotional,Energy/Drive,Learn,Memory,Perception,Problem Solve,Safety Awareness,Temperament/Personality,Thought,Understand Psychosocial Skills: Coping Strategies,Environmental  Adaptations,Habits,Interpersonal Interaction,Routines and Behaviors   Visit Diagnosis: Difficulty coping  Frontal lobe and executive function deficit  Bipolar 2 disorder (HCC)    Problem List There are no problems to display for  this patient.   01/18/2021  Savannah Hazel, MOT, OTR/L  01/18/2021, 3:52 PM  [facility] HOSPITALIZATION PROGRAM [address] [place], [place], [address] Phone: [phone]   Fax:  [phone]  Name: Savannah [name] MRN: [id number] Date of Birth: 12/07/1985

## 2021-01-19 ENCOUNTER — Other Ambulatory Visit: Payer: Self-pay

## 2021-01-19 ENCOUNTER — Other Ambulatory Visit (HOSPITAL_COMMUNITY): Payer: 59 | Admitting: Licensed Clinical Social Worker

## 2021-01-19 ENCOUNTER — Other Ambulatory Visit (HOSPITAL_COMMUNITY): Payer: 59

## 2021-01-19 DIAGNOSIS — F3181 Bipolar II disorder: Secondary | ICD-10-CM

## 2021-01-20 ENCOUNTER — Other Ambulatory Visit (HOSPITAL_COMMUNITY): Payer: 59 | Admitting: Occupational Therapy

## 2021-01-20 ENCOUNTER — Encounter (HOSPITAL_COMMUNITY): Payer: Self-pay

## 2021-01-20 ENCOUNTER — Other Ambulatory Visit: Payer: Self-pay

## 2021-01-20 ENCOUNTER — Other Ambulatory Visit (HOSPITAL_COMMUNITY): Payer: 59 | Admitting: Licensed Clinical Social Worker

## 2021-01-20 DIAGNOSIS — F3181 Bipolar II disorder: Secondary | ICD-10-CM

## 2021-01-20 DIAGNOSIS — R4589 Other symptoms and signs involving emotional state: Secondary | ICD-10-CM

## 2021-01-20 DIAGNOSIS — R41844 Frontal lobe and executive function deficit: Secondary | ICD-10-CM

## 2021-01-20 NOTE — Therapy (Signed)
[name] PARTIAL HOSPITALIZATION PROGRAM [address] [place], [place], [address] Phone: [phone]   Fax:  [phone] Virtual Visit via Video Note  I connected with Savannah [name] on 01/20/21 at  10:45 AM EDT by a video enabled telemedicine application and verified that I am speaking with the correct person using two identifiers.  Location: Patient: Patient Home Provider: Clinic Office    I discussed the limitations of evaluation and management by telemedicine and the availability of in person appointments. The patient expressed understanding and agreed to proceed.   I discussed the assessment and treatment plan with the patient. The patient was provided an opportunity to ask questions and all were answered. The patient agreed with the plan and demonstrated an understanding of the instructions.   The patient was advised to call back or seek an in-person evaluation if the symptoms worsen or if the condition fails to improve as anticipated.  I provided 60 minutes of non-face-to-face time during this encounter.   Donne Hazel, OT   Occupational Therapy Treatment  Patient Details  Name: Savannah [name] MRN: [id number] Date of Birth: 09/19/1985 Referring Provider (OT): [name]   Encounter Date: 01/20/2021   OT End of Session - 01/20/21 1202    Visit Number 2    Number of Visits 20    Date for OT Re-Evaluation 02/15/21    Authorization Type [employer]    OT Start Time 1045    OT Stop Time 1145    OT Time Calculation (min) 60 min    Activity Tolerance Patient tolerated treatment well    Behavior During Therapy [facility] for tasks assessed/performed;Restless           Past Medical History:  Diagnosis Date  . ADHD   . Anxiety   . Bipolar disorder (HCC)   . Depression   . Diabetes mellitus, type II (HCC)   . Headache   . Hypertension   . Insomnia   . PTSD (post-traumatic stress disorder)     Past Surgical History:   Procedure Laterality Date  . TONSILLECTOMY Bilateral 2021    There were no vitals filed for this visit.   Subjective Assessment - 01/20/21 1202    Currently in Pain? No/denies            OT Education - 01/20/21 1202    Education Details Educated on different communication styles with strategies to become more assertive with use of XYZ communication tool    Person(s) Educated Patient    Methods Explanation;Handout    Comprehension Verbalized understanding            OT Short Term Goals - 01/20/21 1202      OT SHORT TERM GOAL #1   Title Pt will actively engage in OT group sessions throughout duration of PHP programming, in order to promote daily structure, social engagement, and opportunities to develop and utilize adaptive strategies to maximize functional performance in preparation for safe transition and integration back into school, work, and the community.    Status On-going      OT SHORT TERM GOAL #2   Title Pt will demonstrate improved ability to communicate feelings/needs/wants, without being angry/irritable/aggressive/manic, as evidenced by, active participation in OT sessions, throughout duration of PHP programming, in order to safely transition back into the community at discharge.    Status On-going      OT SHORT TERM GOAL #3   Title Pt will identify a minimum of two self-soothing relaxation  strategies she can utilize in the moment, to safely manage increased anxiety/PTSD flashbacks, without engaging in self-harm behaviors,  in preparation for safe community reintegration.    Status On-going         Group Session:  S: "I need to learn how to talk to my parents, because they have a habit of gaslighting me."  O: Group began with a reflection from previous OT session focused on communication styles and group members re-iterated what was learned during previous session. Members shared and reflected on any opportunities they were presented with last evening to  practice their assertiveness skills or recognize patterns of communication observed. Today's group focused on assertiveness skills training and use of the XYZ* assertive communication tool was introduced. The XYZ communication tool states: I feel X when you do Y in situation Z and I would like _________. X is the emotion, Y is the specific behavior, and Z is the specific situation. Group members each formulated their own XYZ statement and shared with the group to discuss and offer feedback. Additional tips and strategies to practice being assertive were also introduced and discussed.  A: Nyeshia was active and engaged in her participation of discussion and activity, though did appear restless and hypomanic throughout session. Pt got up from her desk on multiple occasions to let the cats out on the porch, use the bathroom, grab lunch, and then get her cats back off the porch due to fear they were going to fall. Pt appeared disorganized and distracted, though able to engage with redirection and prompting. She shared that she struggles to communicate assertively with her parents and appeared receptive and open to trying to apply these strategies/skills to her life.   P: Continue to attend PHP OT group sessions 5x week for 3 weeks to promote daily structure, social engagement, and opportunities to develop and utilize adaptive strategies to maximize functional performance in preparation for safe transition and integration back into school, work, and the community.    Plan - 01/20/21 1202    Occupational performance deficits (Please refer to evaluation for details): ADL's;IADL's;Rest and Sleep;Education;Work;Leisure;Social Participation    Body Structure / Function / Physical Skills ADL    Cognitive Skills Attention;Emotional;Energy/Drive;Learn;Memory;Perception;Problem Solve;Safety Awareness;Temperament/Personality;Thought;Understand    Psychosocial Skills Coping Strategies;Environmental   Adaptations;Habits;Interpersonal Interaction;Routines and Behaviors           Patient will benefit from skilled therapeutic intervention in order to improve the following deficits and impairments:   Body Structure / Function / Physical Skills: ADL Cognitive Skills: Attention,Emotional,Energy/Drive,Learn,Memory,Perception,Problem Solve,Safety Awareness,Temperament/Personality,Thought,Understand Psychosocial Skills: Coping Strategies,Environmental  Adaptations,Habits,Interpersonal Interaction,Routines and Behaviors   Visit Diagnosis: Difficulty coping  Frontal lobe and executive function deficit  Bipolar 2 disorder (HCC)    Problem List There are no problems to display for this patient.   01/20/2021  Donne Hazel, MOT, OTR/L  01/20/2021, 12:03 PM  [facility] HOSPITALIZATION PROGRAM [address] [place], [place], [address] Phone: [phone]   Fax:  [phone]  Name: Chesnee Floren MRN: [id number] Date of Birth: 03/07/1986

## 2021-01-23 ENCOUNTER — Encounter (HOSPITAL_COMMUNITY): Payer: Self-pay

## 2021-01-23 ENCOUNTER — Other Ambulatory Visit: Payer: Self-pay

## 2021-01-23 ENCOUNTER — Other Ambulatory Visit (HOSPITAL_COMMUNITY): Payer: 59 | Admitting: Occupational Therapy

## 2021-01-23 ENCOUNTER — Other Ambulatory Visit (HOSPITAL_COMMUNITY): Payer: 59 | Admitting: Licensed Clinical Social Worker

## 2021-01-23 DIAGNOSIS — R4589 Other symptoms and signs involving emotional state: Secondary | ICD-10-CM

## 2021-01-23 DIAGNOSIS — F3181 Bipolar II disorder: Secondary | ICD-10-CM | POA: Diagnosis not present

## 2021-01-23 DIAGNOSIS — R41844 Frontal lobe and executive function deficit: Secondary | ICD-10-CM

## 2021-01-23 NOTE — Therapy (Signed)
[facility] PARTIAL HOSPITALIZATION PROGRAM [address] [place], [place], [address] Phone: [phone]   Fax:  [phone]  Virtual Visit via Video Note  I connected with Savannah [name] on 01/23/21 at  11:00 AM EDT by a video enabled telemedicine application and verified that I am speaking with the correct person using two identifiers.  Location: Patient: Patient Home Provider: Clinic Office   I discussed the limitations of evaluation and management by telemedicine and the availability of in person appointments. The patient expressed understanding and agreed to proceed.  I discussed the assessment and treatment plan with the patient. The patient was provided an opportunity to ask questions and all were answered. The patient agreed with the plan and demonstrated an understanding of the instructions.   The patient was advised to call back or seek an in-person evaluation if the symptoms worsen or if the condition fails to improve as anticipated.  I provided 60 minutes of non-face-to-face time during this encounter.   Donne Hazel, OT   Occupational Therapy Treatment  Patient Details  Name: Savannah [name] MRN: [id number] Date of Birth: 1986-04-22 Referring Provider (OT): [name]   Encounter Date: 01/23/2021   OT End of Session - 01/23/21 1529    Visit Number 3    Number of Visits 20    Date for OT Re-Evaluation 02/15/21    Authorization Type [employer]    OT Start Time 1100    OT Stop Time 1200    OT Time Calculation (min) 60 min    Activity Tolerance Patient tolerated treatment well    Behavior During Therapy [facility] for tasks assessed/performed;Restless           Past Medical History:  Diagnosis Date  . ADHD   . Anxiety   . Bipolar disorder (HCC)   . Depression   . Diabetes mellitus, type II (HCC)   . Headache   . Hypertension   . Insomnia   . PTSD (post-traumatic stress disorder)     Past Surgical History:   Procedure Laterality Date  . TONSILLECTOMY Bilateral 2021    There were no vitals filed for this visit.   Subjective Assessment - 01/23/21 1529    Currently in Pain? No/denies                                OT Education - 01/23/21 1529    Education Details Educated on physical symptomology of stress and its effects on the body, along with positive stress management tips/strategies    Person(s) Educated Patient    Methods Explanation;Handout    Comprehension Verbalized understanding            OT Short Term Goals - 01/20/21 1202      OT SHORT TERM GOAL #1   Title Pt will actively engage in OT group sessions throughout duration of PHP programming, in order to promote daily structure, social engagement, and opportunities to develop and utilize adaptive strategies to maximize functional performance in preparation for safe transition and integration back into school, work, and the community.    Status On-going      OT SHORT TERM GOAL #2   Title Pt will demonstrate improved ability to communicate feelings/needs/wants, without being angry/irritable/aggressive/manic, as evidenced by, active participation in OT sessions, throughout duration of PHP programming, in order to safely transition back into the community at discharge.    Status On-going  OT SHORT TERM GOAL #3   Title Pt will identify a minimum of two self-soothing relaxation strategies she can utilize in the moment, to safely manage increased anxiety/PTSD flashbacks, without engaging in self-harm behaviors,  in preparation for safe community reintegration.    Status On-going         Group Session:  S: "My parents want me to go to college and get an education, but I don't want to waste my money on that and that is stressing me out that they are telling me what to do."  O:  Group began with a check-in over/about the weekend. Today's discussion focused on the topic of stress management. Group members  worked collaboratively to create a Microbiologist identifying physical signs, behavioral signs, emotional/psychological, and cognitive signs of stress. Discussion then focused and encouraged group members to identify positive stress management strategies they could utilize in those moments to manage identified signs.  A: Savannah [name] was active in her participation of discussion/activity sharing that having her family tell her what to do with her life and not agreeing with her choices as something that is currently stressful to her. She shared that her family believes she needs to go to college in order to be successful, however pt does not agree with this and is tired of "family telling me what to do." Pt appeared mostly receptive to education, support, and information provided during today's session.   P: Continue to attend PHP OT group sessions 5x week for 2 weeks to promote daily structure, social engagement, and opportunities to develop and utilize adaptive strategies to maximize functional performance in preparation for safe transition and integration back into school, work, and the community. Plan to address topic of sensory modulation in next OT group session.    Plan - 01/23/21 1529    Occupational performance deficits (Please refer to evaluation for details): ADL's;IADL's;Rest and Sleep;Education;Work;Leisure;Social Participation    Body Structure / Function / Physical Skills ADL    Cognitive Skills Attention;Emotional;Energy/Drive;Learn;Memory;Perception;Problem Solve;Safety Awareness;Temperament/Personality;Thought;Understand    Psychosocial Skills Coping Strategies;Environmental  Adaptations;Habits;Interpersonal Interaction;Routines and Behaviors           Patient will benefit from skilled therapeutic intervention in order to improve the following deficits and impairments:   Body Structure / Function / Physical Skills: ADL Cognitive Skills:  Attention,Emotional,Energy/Drive,Learn,Memory,Perception,Problem Solve,Safety Awareness,Temperament/Personality,Thought,Understand Psychosocial Skills: Coping Strategies,Environmental  Adaptations,Habits,Interpersonal Interaction,Routines and Behaviors   Visit Diagnosis: Difficulty coping  Frontal lobe and executive function deficit  Bipolar 2 disorder (HCC)    Problem List There are no problems to display for this patient.   01/23/2021  Donne Hazel, MOT, OTR/L  01/23/2021, 3:30 PM  [facility] HOSPITALIZATION PROGRAM [address] [place], [place], [address] Phone: [phone]   Fax:  [phone]  Name: Savannah [name] MRN: [id number] Date of Birth: 1986/07/11

## 2021-01-24 ENCOUNTER — Other Ambulatory Visit (HOSPITAL_COMMUNITY): Payer: 59 | Admitting: Occupational Therapy

## 2021-01-24 ENCOUNTER — Other Ambulatory Visit (HOSPITAL_COMMUNITY): Payer: 59 | Admitting: Licensed Clinical Social Worker

## 2021-01-24 ENCOUNTER — Encounter (HOSPITAL_COMMUNITY): Payer: Self-pay

## 2021-01-24 ENCOUNTER — Encounter (HOSPITAL_COMMUNITY): Payer: Self-pay | Admitting: Family

## 2021-01-24 ENCOUNTER — Other Ambulatory Visit: Payer: Self-pay

## 2021-01-24 DIAGNOSIS — R41844 Frontal lobe and executive function deficit: Secondary | ICD-10-CM

## 2021-01-24 DIAGNOSIS — F3181 Bipolar II disorder: Secondary | ICD-10-CM

## 2021-01-24 DIAGNOSIS — F331 Major depressive disorder, recurrent, moderate: Secondary | ICD-10-CM

## 2021-01-24 DIAGNOSIS — R4589 Other symptoms and signs involving emotional state: Secondary | ICD-10-CM

## 2021-01-24 NOTE — Progress Notes (Signed)
Virtual Visit via Video Note  I connected with [name] on 01/24/21 at  9:00 AM EDT by a video enabled telemedicine application and verified that I am speaking with the correct person using two identifiers.  Location: Patient: Home Provider: Office   I discussed the limitations of evaluation and management by telemedicine and the availability of in person appointments. The patient expressed understanding and agreed to proceed.   I discussed the assessment and treatment plan with the patient. The patient was provided an opportunity to ask questions and all were answered. The patient agreed with the plan and demonstrated an understanding of the instructions.   The patient was advised to call back or seek an in-person evaluation if the symptoms worsen or if the condition fails to improve as anticipated.  I provided 15 minutes of non-face-to-face time during this encounter.   [name], NP   BH MD/PA/NP OP Progress Note  01/24/2021 3:52 PM Cheyne Bungert  MRN:  [id number]  Jozee observed attending daily group sessions with active and engaged participation.  Denying suicidal or homicidal ideations.  Denies auditory or visual hallucinations.  Patient continues to present hypomanic she was initiated on Seroquel 25 mg twice daily and 100 mg nightly.  She reports medication has " made me feel like a zombie."  Discussed taking Seroquel 50 nightly we will titrate where appropriate.  She reports she is eager to follow-up with her new psychiatrist in order to receive " the right diagnosis" reports she was diagnosed with attention deficit disorder in the past.  However, she feels Autism or Asperger's is fitting for her symptoms.    Reports she is resting well with current medications.  Patient reports she was on Lamictal however states she did not like taking this medication.  Did not initiate lithium due to hypertension medication.  Patient to continue Seroquel.  We will continue to  follow.  Support, encouragement and reassurance was provided.  Visit Diagnosis:    ICD-10-CM   1. Bipolar 2 disorder (HCC)  F31.81   2. MDD (major depressive disorder), recurrent episode, moderate (HCC)  F33.1     Past Psychiatric History:   Past Medical History:  Past Medical History:  Diagnosis Date  . ADHD   . Anxiety   . Bipolar disorder (HCC)   . Depression   . Diabetes mellitus, type II (HCC)   . Headache   . Hypertension   . Insomnia   . PTSD (post-traumatic stress disorder)     Past Surgical History:  Procedure Laterality Date  . TONSILLECTOMY Bilateral 2021    Family Psychiatric History:   Family History: No family history on file.  Social History:  Social History   Socioeconomic History  . Marital status: Divorced    Spouse name: Not on file  . Number of children: Not on file  . Years of education: Not on file  . Highest education level: High school graduate  Occupational History  . Not on file  Tobacco Use  . Smoking status: Never Smoker  . Smokeless tobacco: Never Used  Vaping Use  . Vaping Use: Never used  Substance and Sexual Activity  . Alcohol use: Yes    Comment: weekly  . Drug use: Yes    Types: Marijuana  . Sexual activity: Not on file  Other Topics Concern  . Not on file  Social History Narrative  . Not on file   Social Determinants of Health   Financial Resource Strain: Not on file  Food Insecurity: Not on file  Transportation Needs: Not on file  Physical Activity: Not on file  Stress: Not on file  Social Connections: Not on file    Allergies: No Known Allergies  Metabolic Disorder Labs: No results found for: HGBA1C, MPG No results found for: PROLACTIN No results found for: CHOL, TRIG, HDL, CHOLHDL, VLDL, LDLCALC No results found for: TSH  Therapeutic Level Labs: No results found for: LITHIUM No results found for: VALPROATE No components found for:  CBMZ  Current Medications: Current Outpatient Medications   Medication Sig Dispense Refill  . ALPRAZolam (XANAX) 0.5 MG tablet 1 tablet    . amphetamine-dextroamphetamine (ADDERALL XR) 20 MG 24 hr capsule 10 mg.    . Dulaglutide (TRULICITY) 0.75 MG/0.5ML SOPN See admin instructions.    Marland Kitchen escitalopram (LEXAPRO) 20 MG tablet 1 tablet (Patient not taking: No sig reported)    . Eszopiclone 3 MG TABS 1 tablet immediately before bedtime (Patient not taking: No sig reported)    . levonorgestrel (MIRENA, 52 MG,) 20 MCG/24HR IUD     . lisinopril-hydrochlorothiazide (ZESTORETIC) 20-12.5 MG tablet 1 tablet    . prazosin (MINIPRESS) 1 MG capsule Take 1 capsule (1 mg total) by mouth at bedtime. 30 capsule 0  . QUEtiapine (SEROQUEL) 100 MG tablet Take 1 tablet (100 mg total) by mouth at bedtime. 30 tablet 0  . QUEtiapine (SEROQUEL) 25 MG tablet Take 1 tablet (25 mg total) by mouth 2 (two) times daily. 60 tablet 0  . rizatriptan (MAXALT-MLT) 10 MG disintegrating tablet 1 tablet    . topiramate (TOPAMAX) 25 MG tablet 1 tablet    . traZODone (DESYREL) 50 MG tablet Take by mouth. (Patient not taking: No sig reported)     No current facility-administered medications for this visit.     Musculoskeletal: Strength & Muscle Tone: within normal limits Gait & Station: normal Patient leans: N/A  Psychiatric Specialty Exam: Review of Systems  There were no vitals taken for this visit.There is no height or weight on file to calculate BMI.  General Appearance: Casual  Eye Contact:  Good  Speech:  Clear and Coherent  Volume:  Normal  Mood:  Anxious and Depressed  Affect:  Congruent  Thought Process:  Linear and Descriptions of Associations: Circumstantial  Orientation:  Full (Time, Place, and Person)  Thought Content: Logical and Rumination   Suicidal Thoughts:  No  Homicidal Thoughts:  No  Memory:  Immediate;   Fair Recent;   Fair  Judgement:  Fair  Insight:  Fair  Psychomotor Activity:  Normal  Concentration:  Concentration: Fair  Recall:  [employer] of  Knowledge: Fair  Language: Fair  Akathisia:  No  Handed:  Left  AIMS (if indicated):  Assets:  Communication Skills Desire for Improvement Social Support  ADL's:  Intact  Cognition: WNL  Sleep:  NA   Screenings: 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 nightly  Treatment plan was reviewed and agreed upon by NP T. Lemont Sitzmann inpatient Kristen Uriostegui's need for continued group services   [name], NP 01/24/2021, 3:52 PM

## 2021-01-24 NOTE — Therapy (Signed)
[facility] PARTIAL HOSPITALIZATION PROGRAM [address] [place], [place], [address] Phone: [phone]   Fax:  [phone] Virtual Visit via Video Note  I connected with Savannah [name] on 01/24/21 at  11:00 AM EDT by a video enabled telemedicine application and verified that I am speaking with the correct person using two identifiers.  Location: Patient: Patient Home Provider: Clinic Office   I discussed the limitations of evaluation and management by telemedicine and the availability of in person appointments. The patient expressed understanding and agreed to proceed.   I discussed the assessment and treatment plan with the patient. The patient was provided an opportunity to ask questions and all were answered. The patient agreed with the plan and demonstrated an understanding of the instructions.   The patient was advised to call back or seek an in-person evaluation if the symptoms worsen or if the condition fails to improve as anticipated.  I provided 60 minutes of non-face-to-face time during this encounter.   Donne Hazel, OT   Occupational Therapy Treatment  Patient Details  Name: Savannah [name] MRN: [id number] Date of Birth: 10-Apr-1986 Referring Provider (OT): [name]   Encounter Date: 01/24/2021   OT End of Session - 01/24/21 1532    Visit Number 4    Number of Visits 20    Date for OT Re-Evaluation 02/15/21    Authorization Type [employer]    OT Start Time 1105    OT Stop Time 1205    OT Time Calculation (min) 60 min    Activity Tolerance Patient tolerated treatment well    Behavior During Therapy [facility] for tasks assessed/performed;Restless           Past Medical History:  Diagnosis Date  . ADHD   . Anxiety   . Bipolar disorder (HCC)   . Depression   . Diabetes mellitus, type II (HCC)   . Headache   . Hypertension   . Insomnia   . PTSD (post-traumatic stress disorder)     Past Surgical History:   Procedure Laterality Date  . TONSILLECTOMY Bilateral 2021    There were no vitals filed for this visit.   Subjective Assessment - 01/24/21 1532    Currently in Pain? No/denies            OT Education - 01/24/21 1532    Education Details Educated on concept of sensory modulation and self-soothing as coping strategies through use of the eight senses    Person(s) Educated Patient    Methods Explanation;Handout    Comprehension Verbalized understanding            OT Short Term Goals - 01/20/21 1202      OT SHORT TERM GOAL #1   Title Pt will actively engage in OT group sessions throughout duration of PHP programming, in order to promote daily structure, social engagement, and opportunities to develop and utilize adaptive strategies to maximize functional performance in preparation for safe transition and integration back into school, work, and the community.    Status On-going      OT SHORT TERM GOAL #2   Title Pt will demonstrate improved ability to communicate feelings/needs/wants, without being angry/irritable/aggressive/manic, as evidenced by, active participation in OT sessions, throughout duration of PHP programming, in order to safely transition back into the community at discharge.    Status On-going      OT SHORT TERM GOAL #3   Title Pt will identify a minimum of two self-soothing relaxation strategies  she can utilize in the moment, to safely manage increased anxiety/PTSD flashbacks, without engaging in self-harm behaviors,  in preparation for safe community reintegration.    Status On-going           Group Session:  S: "I like to doodle and color and yoga is super good too if I actually did it."  O: Today's group session focused on topic of sensory modulation and self-soothing through use of the 8 senses. Discussion introduced the concept of sensory modulation and integration, focusing on how we can utilize our body and it's senses to self-soothe or cope, when we  are experiencing an over or under-whelming sensation or feeling. Group members were introduced to a sensory diet checklist as a helpful tool/resource that can be utilized to identify what activities and strategies we prefer and do not prefer based upon our response to different stimulus. The concept of alerting vs calming activities was also introduced to understand how to counteract how we are feeling (Example: when we are feeling overwhelmed/stressed, engage in something calming. When we are feeling depressed/low energy, engage in something alerting). Group members engaged actively in discussion sharing their own personal sensory likes/dislikes.    A: Savannah [name] was active and independent in her participation of discussion and activity sharing that some activities she engages in to self-soothe include doodling and coloring, which she shared she was doing in group. She also identified over self-soothing sensory strategies including distraction and yoga, sharing in the past she used to distract herself by going to [employer]. Pt appeared attentive in listening to additional strategies and suggestions offered to her by peers.   P: Continue to attend PHP OT group sessions 5x week for 2 weeks to promote daily structure, social engagement, and opportunities to develop and utilize adaptive strategies to maximize functional performance in preparation for safe transition and integration back into school, work, and the community.    Plan - 01/24/21 1532    Occupational performance deficits (Please refer to evaluation for details): ADL's;IADL's;Rest and Sleep;Education;Work;Leisure;Social Participation    Body Structure / Function / Physical Skills ADL    Cognitive Skills Attention;Emotional;Energy/Drive;Learn;Memory;Perception;Problem Solve;Safety Awareness;Temperament/Personality;Thought;Understand    Psychosocial Skills Coping Strategies;Environmental  Adaptations;Habits;Interpersonal Interaction;Routines and  Behaviors           Patient will benefit from skilled therapeutic intervention in order to improve the following deficits and impairments:   Body Structure / Function / Physical Skills: ADL Cognitive Skills: Attention,Emotional,Energy/Drive,Learn,Memory,Perception,Problem Solve,Safety Awareness,Temperament/Personality,Thought,Understand Psychosocial Skills: Coping Strategies,Environmental  Adaptations,Habits,Interpersonal Interaction,Routines and Behaviors   Visit Diagnosis: Difficulty coping  Frontal lobe and executive function deficit  Bipolar 2 disorder (HCC)    Problem List There are no problems to display for this patient.   01/24/2021  Donne Hazel, MOT, OTR/L  01/24/2021, 3:33 PM  [facility] HOSPITALIZATION PROGRAM [address] [place], [place], [address] Phone: [phone]   Fax:  [phone]  Name: Savannah [name] MRN: [id number] Date of Birth: 1986/08/01

## 2021-01-25 ENCOUNTER — Other Ambulatory Visit (HOSPITAL_COMMUNITY): Payer: 59 | Admitting: Occupational Therapy

## 2021-01-25 ENCOUNTER — Other Ambulatory Visit: Payer: Self-pay

## 2021-01-25 ENCOUNTER — Encounter (HOSPITAL_COMMUNITY): Payer: Self-pay

## 2021-01-25 ENCOUNTER — Other Ambulatory Visit (HOSPITAL_COMMUNITY): Payer: 59 | Admitting: Licensed Clinical Social Worker

## 2021-01-25 DIAGNOSIS — F332 Major depressive disorder, recurrent severe without psychotic features: Secondary | ICD-10-CM

## 2021-01-25 DIAGNOSIS — R4589 Other symptoms and signs involving emotional state: Secondary | ICD-10-CM

## 2021-01-25 DIAGNOSIS — F3181 Bipolar II disorder: Secondary | ICD-10-CM | POA: Diagnosis not present

## 2021-01-25 DIAGNOSIS — R41844 Frontal lobe and executive function deficit: Secondary | ICD-10-CM

## 2021-01-25 NOTE — Therapy (Signed)
[facility] PARTIAL HOSPITALIZATION PROGRAM [address] [place], [place], [address] Phone: [phone]   Fax:  [phone] Virtual Visit via Video Note  I connected with Savannah [name] on 01/25/21 at  12:00 PM EDT by a video enabled telemedicine application and verified that I am speaking with the correct person using two identifiers.  Location: Patient: Patient Home Provider: Clinic Office   I discussed the limitations of evaluation and management by telemedicine and the availability of in person appointments. The patient expressed understanding and agreed to proceed.   I discussed the assessment and treatment plan with the patient. The patient was provided an opportunity to ask questions and all were answered. The patient agreed with the plan and demonstrated an understanding of the instructions.   The patient was advised to call back or seek an in-person evaluation if the symptoms worsen or if the condition fails to improve as anticipated.  I provided 50 minutes of non-face-to-face time during this encounter.   Donne Hazel, OT   Occupational Therapy Treatment  Patient Details  Name: Savannah [name] MRN: [id number] Date of Birth: 27-Dec-1985 Referring Provider (OT): [name]   Encounter Date: 01/25/2021   OT End of Session - 01/25/21 1522    Visit Number 5    Number of Visits 20    Date for OT Re-Evaluation 02/15/21    Authorization Type [employer]    OT Start Time 1200    OT Stop Time 1250    OT Time Calculation (min) 50 min    Activity Tolerance Patient tolerated treatment well    Behavior During Therapy [facility] for tasks assessed/performed;Restless           Past Medical History:  Diagnosis Date  . ADHD   . Anxiety   . Bipolar disorder (HCC)   . Depression   . Diabetes mellitus, type II (HCC)   . Headache   . Hypertension   . Insomnia   . PTSD (post-traumatic stress disorder)     Past Surgical History:   Procedure Laterality Date  . TONSILLECTOMY Bilateral 2021    There were no vitals filed for this visit.   Subjective Assessment - 01/25/21 1521    Currently in Pain? No/denies            OT Education - 01/25/21 1522    Education Details Continued education on sensory modulation and self-soothing as coping strategies through use of the eight senses    Person(s) Educated Patient    Methods Explanation;Handout    Comprehension Verbalized understanding            OT Short Term Goals - 01/20/21 1202      OT SHORT TERM GOAL #1   Title Pt will actively engage in OT group sessions throughout duration of PHP programming, in order to promote daily structure, social engagement, and opportunities to develop and utilize adaptive strategies to maximize functional performance in preparation for safe transition and integration back into school, work, and the community.    Status On-going      OT SHORT TERM GOAL #2   Title Pt will demonstrate improved ability to communicate feelings/needs/wants, without being angry/irritable/aggressive/manic, as evidenced by, active participation in OT sessions, throughout duration of PHP programming, in order to safely transition back into the community at discharge.    Status On-going      OT SHORT TERM GOAL #3   Title Pt will identify a minimum of two self-soothing relaxation strategies she  can utilize in the moment, to safely manage increased anxiety/PTSD flashbacks, without engaging in self-harm behaviors,  in preparation for safe community reintegration.    Status On-going         Group Session:  S: "I just lit some incense which I find calming and I have a lots of essential oils and stuff too."  O: Today's group session continued focus on topic of sensory modulation and self-soothing through use of the 8 senses. Discussion introduced the concept of sensory modulation and integration, focusing on how we can utilize our body and it's senses to  self-soothe or cope, when we are experiencing an over or under-whelming sensation or feeling. Group members were introduced to a sensory diet checklist as a helpful tool/resource that can be utilized to identify what activities and strategies we prefer and do not prefer based upon our response to different stimulus. The concept of alerting vs calming activities was also introduced to understand how to counteract how we are feeling (Example: when we are feeling overwhelmed/stressed, engage in something calming. When we are feeling depressed/low energy, engage in something alerting). Group members engaged actively in discussion sharing their own personal sensory likes/dislikes.    A: Savannah [name] was moderately active and engaged in her participation of today's session and discussion, however required cues to engage at times, due to distraction. Pt was restless, often seen up and out of her chair/away from desk, doing misc around the house. Pt reported she was listening, however at times needed discussion topics repeated or reviewed for clarity. Pt was able to share and identify some additional self-soothing strategies she has found helpful including listening to the sound of a water fountain, listening to the Beatles, and using diffusers/essential oils. Pt was somewhat receptive to additional education and strategies offered.   P: Continue to attend PHP OT group sessions 5x week for 2 weeks to promote daily structure, social engagement, and opportunities to develop and utilize adaptive strategies to maximize functional performance in preparation for safe transition and integration back into school, work, and the community.    Plan - 01/25/21 1522    Occupational performance deficits (Please refer to evaluation for details): ADL's;IADL's;Rest and Sleep;Education;Work;Leisure;Social Participation    Body Structure / Function / Physical Skills ADL    Cognitive Skills  Attention;Emotional;Energy/Drive;Learn;Memory;Perception;Problem Solve;Safety Awareness;Temperament/Personality;Thought;Understand    Psychosocial Skills Coping Strategies;Environmental  Adaptations;Habits;Interpersonal Interaction;Routines and Behaviors           Patient will benefit from skilled therapeutic intervention in order to improve the following deficits and impairments:   Body Structure / Function / Physical Skills: ADL Cognitive Skills: Attention,Emotional,Energy/Drive,Learn,Memory,Perception,Problem Solve,Safety Awareness,Temperament/Personality,Thought,Understand Psychosocial Skills: Coping Strategies,Environmental  Adaptations,Habits,Interpersonal Interaction,Routines and Behaviors   Visit Diagnosis: Difficulty coping  Frontal lobe and executive function deficit  Severe episode of recurrent major depressive disorder, without psychotic features (HCC)    Problem List There are no problems to display for this patient.   01/25/2021  Donne Hazel, MOT, OTR/L  01/25/2021, 3:23 PM  [facility] HOSPITALIZATION PROGRAM [address] [place], [place], [address] Phone: [phone]   Fax:  [phone]  Name: Savannah [name] MRN: [id number] Date of Birth: 09/04/1985

## 2021-01-26 ENCOUNTER — Encounter (HOSPITAL_COMMUNITY): Payer: Self-pay

## 2021-01-26 ENCOUNTER — Other Ambulatory Visit: Payer: Self-pay

## 2021-01-26 ENCOUNTER — Encounter: Payer: 59 | Admitting: Obstetrics and Gynecology

## 2021-01-26 ENCOUNTER — Other Ambulatory Visit (HOSPITAL_COMMUNITY): Payer: 59 | Admitting: Occupational Therapy

## 2021-01-26 DIAGNOSIS — F332 Major depressive disorder, recurrent severe without psychotic features: Secondary | ICD-10-CM

## 2021-01-26 DIAGNOSIS — F3181 Bipolar II disorder: Secondary | ICD-10-CM | POA: Diagnosis not present

## 2021-01-26 DIAGNOSIS — R4589 Other symptoms and signs involving emotional state: Secondary | ICD-10-CM

## 2021-01-26 DIAGNOSIS — R41844 Frontal lobe and executive function deficit: Secondary | ICD-10-CM

## 2021-01-26 NOTE — Therapy (Signed)
Savannah [name], [place], [address] Phone: [phone]   Fax:  [phone] Virtual Visit via Video Note  I connected with Savannah [name] on 01/26/21 at  11:00 AM EDT by a video enabled telemedicine application and verified that I am speaking with the correct person using two identifiers.  Location: Patient: Patient Home Provider: Clinic Office    I discussed the limitations of evaluation and management by telemedicine and the availability of in person appointments. The patient expressed understanding and agreed to proceed.   I discussed the assessment and treatment plan with the patient. The patient was provided an opportunity to ask questions and all were answered. The patient agreed with the plan and demonstrated an understanding of the instructions.   The patient was advised to call back or seek an in-person evaluation if the symptoms worsen or if the condition fails to improve as anticipated.  I provided 60 minutes of non-face-to-face time during this encounter.   Ponciano Ort, OT   Occupational Therapy Treatment  Patient Details  Name: Savannah [name] MRN: [id number] Date of Birth: 07-03-1986 Referring Provider (OT): Ricky Ala   Encounter Date: 01/26/2021   OT End of Session - 01/26/21 1222    Visit Number 6    Number of Visits 20    Date for OT Re-Evaluation 02/15/21    Authorization Type [employer] No Deduct Copay 20.00   Visit limit 30- 0 used   OOP 2900-223.44 met   S/w Marlowe Kays Reference#[id number]    Authorization - Number of Visits 30    OT Start Time 6808    OT Stop Time 1205    OT Time Calculation (min) 60 min    Activity Tolerance Patient tolerated treatment well    Behavior During Therapy [facility] for tasks assessed/performed;Restless           Past Medical History:  Diagnosis Date  . ADHD   . Anxiety   . Bipolar disorder ([place])   . Depression   . Diabetes  mellitus, type II ([place])   . Headache   . Hypertension   . Insomnia   . PTSD (post-traumatic stress disorder)     Past Surgical History:  Procedure Laterality Date  . TONSILLECTOMY Bilateral 2021    There were no vitals filed for this visit.   Subjective Assessment - 01/26/21 1222    Currently in Pain? No/denies            OT Education - 01/26/21 1222    Education Details Educated on different factors that contribute to our ability to manage our time, along with specific time management tips/strategies, including procrastination    Person(s) Educated Patient    Methods Explanation;Handout    Comprehension Verbalized understanding            OT Short Term Goals - 01/20/21 1202      OT SHORT TERM GOAL #1   Title Pt will actively engage in OT group sessions throughout duration of PHP programming, in order to promote daily structure, social engagement, and opportunities to develop and utilize adaptive strategies to maximize functional performance in preparation for safe transition and integration back into school, work, and the community.    Status On-going      OT SHORT TERM GOAL #2   Title Pt will demonstrate improved ability to communicate feelings/needs/wants, without being angry/irritable/aggressive/manic, as evidenced by, active participation in OT sessions, throughout duration of PHP programming, in order to safely  transition back into the community at discharge.    Status On-going      OT SHORT TERM GOAL #3   Title Pt will identify a minimum of two self-soothing relaxation strategies she can utilize in the moment, to safely manage increased anxiety/PTSD flashbacks, without engaging in self-harm behaviors,  in preparation for safe community reintegration.    Status On-going         Group Session:  S: "I know that I always need to be doing something or doing nothing. There is no in between, like I have to be busy busy busy, or completely free."  O: Group began with  a recap from previous session focused on sensory modulation/self soothing and patients were encouraged to share any activities they engaged in over the previous afternoon. Discussion then transitioned into today's topic of Time Management. Group members identified ways in which they currently struggle with managing their time and discussion focused on alternative strategies to recognize how we can be more productive and intentional with our time and managing it appropriately. Group members also discussed specific strategies to overcome procrastination.   A: Savannah [name] was active in her participation of discussion and activity, sharing that when it comes to time management, she struggles with being busy for the whole day or completely free, sharing there is no balance or in between. She shared that when there is more of a balance, she struggles to find ways to fill her free time, and would prefer to be busy and scheduled from when she wakes up until she goes to sleep. She appeared somewhat receptive to suggestions and strategies offered, noting that keeping herself organized in her phone and email are also important aspects to her time management.   P: Continue to attend PHP OT group sessions 5x week for 2 weeks to promote daily structure, social engagement, and opportunities to develop and utilize adaptive strategies to maximize functional performance in preparation for safe transition and integration back into school, work, and the community. Plan to address topic of procrastination in next OT group session.   Plan - 01/26/21 1223    Occupational performance deficits (Please refer to evaluation for details): ADL's;IADL's;Rest and Sleep;Education;Work;Leisure;Social Participation    Body Structure / Function / Physical Skills ADL    Cognitive Skills Attention;Emotional;Energy/Drive;Learn;Memory;Perception;Problem Solve;Safety Awareness;Temperament/Personality;Thought;Understand    Psychosocial Skills Coping  Strategies;Environmental  Adaptations;Habits;Interpersonal Interaction;Routines and Behaviors           Patient will benefit from skilled therapeutic intervention in order to improve the following deficits and impairments:   Body Structure / Function / Physical Skills: ADL Cognitive Skills: Attention,Emotional,Energy/Drive,Learn,Memory,Perception,Problem Solve,Safety Awareness,Temperament/Personality,Thought,Understand Psychosocial Skills: Coping Strategies,Environmental  Adaptations,Habits,Interpersonal Interaction,Routines and Behaviors   Visit Diagnosis: Difficulty coping  Frontal lobe and executive function deficit  Severe episode of recurrent major depressive disorder, without psychotic features ([place])    Problem List There are no problems to display for this patient.   01/26/2021  Ponciano Ort, MOT, OTR/L  01/26/2021, 12:23 PM  [facility] HOSPITALIZATION PROGRAM [place], [place], [address] Phone: [phone]   Fax:  [phone]  Name: Savannah [name] MRN: [id number] Date of Birth: 1986-05-22

## 2021-01-27 ENCOUNTER — Encounter (HOSPITAL_COMMUNITY): Payer: Self-pay

## 2021-01-27 ENCOUNTER — Other Ambulatory Visit (HOSPITAL_COMMUNITY): Payer: 59 | Admitting: Licensed Clinical Social Worker

## 2021-01-27 ENCOUNTER — Other Ambulatory Visit (HOSPITAL_COMMUNITY): Payer: 59 | Admitting: Occupational Therapy

## 2021-01-27 ENCOUNTER — Other Ambulatory Visit: Payer: Self-pay

## 2021-01-27 DIAGNOSIS — R41844 Frontal lobe and executive function deficit: Secondary | ICD-10-CM

## 2021-01-27 DIAGNOSIS — R4589 Other symptoms and signs involving emotional state: Secondary | ICD-10-CM

## 2021-01-27 DIAGNOSIS — F332 Major depressive disorder, recurrent severe without psychotic features: Secondary | ICD-10-CM

## 2021-01-27 DIAGNOSIS — F3181 Bipolar II disorder: Secondary | ICD-10-CM

## 2021-01-27 NOTE — Therapy (Signed)
[place], [place], [address] Phone: [phone]   Fax:  [phone] Virtual Visit via Video Note  I connected with Savannah [name] on 01/27/21 at  11:00 AM EDT by a video enabled telemedicine application and verified that I am speaking with the correct person using two identifiers.  Location: Patient: Patient Home Provider: Clinic Office   I discussed the limitations of evaluation and management by telemedicine and the availability of in person appointments. The patient expressed understanding and agreed to proceed.   I discussed the assessment and treatment plan with the patient. The patient was provided an opportunity to ask questions and all were answered. The patient agreed with the plan and demonstrated an understanding of the instructions.   The patient was advised to call back or seek an in-person evaluation if the symptoms worsen or if the condition fails to improve as anticipated.  I provided 60 minutes of non-face-to-face time during this encounter.   Ponciano Ort, OT   Occupational Therapy Treatment  Patient Details  Name: Savannah [name] MRN: [id number] Date of Birth: 12-23-1985 Referring Provider (OT): Ricky Ala   Encounter Date: 01/27/2021   OT End of Session - 01/27/21 1444    Visit Number 7    Number of Visits 20    Date for OT Re-Evaluation 02/15/21    Authorization Type [employer] No Deduct Copay 20.00   Visit limit 30- 0 used   OOP 2900-223.44 met   S/w Marlowe Kays Reference#[id number]    Authorization - Number of Visits 30    OT Start Time 5830    OT Stop Time 1205    OT Time Calculation (min) 60 min    Activity Tolerance Patient tolerated treatment well    Behavior During Therapy [facility] for tasks assessed/performed;Restless           Past Medical History:  Diagnosis Date  . ADHD   . Anxiety   . Bipolar disorder ([place])   . Depression   . Diabetes mellitus,  type II ([place])   . Headache   . Hypertension   . Insomnia   . PTSD (post-traumatic stress disorder)     Past Surgical History:  Procedure Laterality Date  . TONSILLECTOMY Bilateral 2021    There were no vitals filed for this visit.   Subjective Assessment - 01/27/21 1444    Currently in Pain? No/denies                                OT Education - 01/27/21 1444    Education Details Continued education on different factors that contribute to our ability to manage our time, along with specific time management tips/strategies, including procrastination    Person(s) Educated Patient    Methods Explanation;Handout    Comprehension Verbalized understanding            OT Short Term Goals - 01/20/21 1202      OT SHORT TERM GOAL #1   Title Pt will actively engage in OT group sessions throughout duration of PHP programming, in order to promote daily structure, social engagement, and opportunities to develop and utilize adaptive strategies to maximize functional performance in preparation for safe transition and integration back into school, work, and the community.    Status On-going      OT SHORT TERM GOAL #2   Title Pt will demonstrate improved ability to communicate feelings/needs/wants,  without being angry/irritable/aggressive/manic, as evidenced by, active participation in OT sessions, throughout duration of PHP programming, in order to safely transition back into the community at discharge.    Status On-going      OT SHORT TERM GOAL #3   Title Pt will identify a minimum of two self-soothing relaxation strategies she can utilize in the moment, to safely manage increased anxiety/PTSD flashbacks, without engaging in self-harm behaviors,  in preparation for safe community reintegration.    Status On-going         Group Session:  S: "I make my doctor appointments in the morning, because if someone asks me to coffee at 9 and I have an appointment at [facility], I  don't go get the coffee."  O: Group began with a recap on strategies/tips learned from previous OT session focused on time management. Today's group continued to focus on topic of time management and reviewed additional strategies to manage and create schedules that are more efficient and effective in meeting their needs. Group members also discussed next steps of transitioning down to IOP group programming and were offered additional strategies/tips, education, and handouts on OT-related topics that group members felt they needed additional resources/support in addressing.    A: Savannah [name] was active in her participation of discussion and activity, sharing that her time management is up and down, but recognized that she prefers having things scheduled in the morning vs the afternoon. She  Shared that if something is schedule for the afternoon, she 'wastes' her whole morning waiting for the scheduled appointment and misses out on other plans or opportunities. She appeared receptive to additional strategies/suggestions offered from peers.  P: Continue to attend PHP OT group sessions 5x week for 2 weeks to promote daily structure, social engagement, and opportunities to develop and utilize adaptive strategies to maximize functional performance in preparation for safe transition and integration back into school, work, and the community.    Plan - 01/27/21 1449    Occupational performance deficits (Please refer to evaluation for details): ADL's;IADL's;Rest and Sleep;Education;Work;Leisure;Social Participation    Body Structure / Function / Physical Skills ADL    Cognitive Skills Attention;Emotional;Energy/Drive;Learn;Memory;Perception;Problem Solve;Safety Awareness;Temperament/Personality;Thought;Understand    Psychosocial Skills Coping Strategies;Environmental  Adaptations;Habits;Interpersonal Interaction;Routines and Behaviors           Patient will benefit from skilled therapeutic intervention in order  to improve the following deficits and impairments:   Body Structure / Function / Physical Skills: ADL Cognitive Skills: Attention,Emotional,Energy/Drive,Learn,Memory,Perception,Problem Solve,Safety Awareness,Temperament/Personality,Thought,Understand Psychosocial Skills: Coping Strategies,Environmental  Adaptations,Habits,Interpersonal Interaction,Routines and Behaviors   Visit Diagnosis: Difficulty coping  Frontal lobe and executive function deficit  Severe episode of recurrent major depressive disorder, without psychotic features ([place])    Problem List There are no problems to display for this patient.   01/27/2021  Ponciano Ort, MOT, OTR/L  01/27/2021, 2:49 PM  [facility] HOSPITALIZATION PROGRAM [place], [place], [address] Phone: [phone]   Fax:  [phone]  Name: Savannah [name] MRN: [id number] Date of Birth: 09/09/1985

## 2021-01-31 ENCOUNTER — Encounter (HOSPITAL_COMMUNITY): Payer: Self-pay

## 2021-01-31 ENCOUNTER — Other Ambulatory Visit (HOSPITAL_COMMUNITY): Payer: 59 | Admitting: Licensed Clinical Social Worker

## 2021-01-31 ENCOUNTER — Encounter (HOSPITAL_COMMUNITY): Payer: Self-pay | Admitting: Family

## 2021-01-31 ENCOUNTER — Other Ambulatory Visit: Payer: Self-pay

## 2021-01-31 ENCOUNTER — Other Ambulatory Visit (HOSPITAL_COMMUNITY): Payer: 59 | Admitting: Occupational Therapy

## 2021-01-31 DIAGNOSIS — F3181 Bipolar II disorder: Secondary | ICD-10-CM

## 2021-01-31 DIAGNOSIS — F331 Major depressive disorder, recurrent, moderate: Secondary | ICD-10-CM

## 2021-01-31 DIAGNOSIS — R41844 Frontal lobe and executive function deficit: Secondary | ICD-10-CM

## 2021-01-31 DIAGNOSIS — R4589 Other symptoms and signs involving emotional state: Secondary | ICD-10-CM

## 2021-01-31 NOTE — Therapy (Signed)
[place], [place], [address] Phone: [phone]   Fax:  [phone] Virtual Visit via Video Note  I connected with Oren Section on 01/31/21 at  12:00 PM EDT by a video enabled telemedicine application and verified that I am speaking with the correct person using two identifiers.  Location: Patient: Patient Home Provider: Clinic Office   I discussed the limitations of evaluation and management by telemedicine and the availability of in person appointments. The patient expressed understanding and agreed to proceed.  I discussed the assessment and treatment plan with the patient. The patient was provided an opportunity to ask questions and all were answered. The patient agreed with the plan and demonstrated an understanding of the instructions.   The patient was advised to call back or seek an in-person evaluation if the symptoms worsen or if the condition fails to improve as anticipated.  I provided 50 minutes of non-face-to-face time during this encounter.   Ponciano Ort, OT   Occupational Therapy Treatment  Patient Details  Name: Savannah [name] MRN: [id number] Date of Birth: 1985-11-28 Referring Provider (OT): Ricky Ala   Encounter Date: 01/31/2021   OT End of Session - 01/31/21 1557    Visit Number 8    Number of Visits 20    Date for OT Re-Evaluation 02/15/21    Authorization Type [employer] No Deduct Copay 20.00   Visit limit 30- 0 used   OOP 2900-223.44 met   S/w Marlowe Kays Reference#[id number]    Authorization - Number of Visits 30    OT Start Time 1200    OT Stop Time 1250    OT Time Calculation (min) 50 min    Activity Tolerance Patient tolerated treatment well    Behavior During Therapy [facility] for tasks assessed/performed;Restless           Past Medical History:  Diagnosis Date  . ADHD   . Anxiety   . Bipolar disorder ([place])   . Depression   . Diabetes mellitus,  type II ([place])   . Headache   . Hypertension   . Insomnia   . PTSD (post-traumatic stress disorder)     Past Surgical History:  Procedure Laterality Date  . TONSILLECTOMY Bilateral 2021    There were no vitals filed for this visit.   Subjective Assessment - 01/31/21 1557    Currently in Pain? No/denies                                OT Education - 01/31/21 1557    Education Details Educated on identifying coping strategies, social supports, and community mental health resources available through use of safety planning tool    Person(s) Educated Patient    Methods Explanation;Handout    Comprehension Verbalized understanding            OT Short Term Goals - 01/20/21 1202      OT SHORT TERM GOAL #1   Title Pt will actively engage in OT group sessions throughout duration of PHP programming, in order to promote daily structure, social engagement, and opportunities to develop and utilize adaptive strategies to maximize functional performance in preparation for safe transition and integration back into school, work, and the community.    Status On-going      OT SHORT TERM GOAL #2   Title Pt will demonstrate improved ability to communicate feelings/needs/wants, without being angry/irritable/aggressive/manic, as  evidenced by, active participation in OT sessions, throughout duration of PHP programming, in order to safely transition back into the community at discharge.    Status On-going      OT SHORT TERM GOAL #3   Title Pt will identify a minimum of two self-soothing relaxation strategies she can utilize in the moment, to safely manage increased anxiety/PTSD flashbacks, without engaging in self-harm behaviors,  in preparation for safe community reintegration.    Status On-going          Group Session:  S: "I like to play Animal Crossing, color, and paint."  O: Today's group discussion focused on the topic of Safety Planning. Patients were educated on  what a safety plan is, what it can be used for, and why we should create one. Group then worked collaboratively and independently to create an individualized safety plan including identifying warning signs, coping strategies, places to go for distraction, social supports, professional supports, and how to make the environment safe. Group members were also encouraged to reflect on the question "What is life worth living for?" Group session ended with patients encouraged to utilize their safety plan as an all-inclusive resource when experiencing a mental health crisis.    A: Savannah [name] was active in her participation of discussion, however continues to present as restless, distracted, and at times appearing hypomanic. Pt shared some of her go-to coping strategies that she can engage in on her own including playing Animal Crossing, coloring, and painting. She said most recently she started doing Door Dash as a distraction and also as a way to make money to pay her rent this month. Pt appeared receptive to education and additional resources provided on safety planning and community support.   P: Continue to attend PHP OT group sessions 5x week for the remainder of the week to promote daily structure, social engagement, and opportunities to develop and utilize adaptive strategies to maximize functional performance in preparation for safe transition and integration back into school, work, and the community. Plan to address topic of goal-setting in next OT group session.   Plan - 01/31/21 1558    Occupational performance deficits (Please refer to evaluation for details): ADL's;IADL's;Rest and Sleep;Education;Work;Leisure;Social Participation    Body Structure / Function / Physical Skills ADL    Cognitive Skills Attention;Emotional;Energy/Drive;Learn;Memory;Perception;Problem Solve;Safety Awareness;Temperament/Personality;Thought;Understand    Psychosocial Skills Coping Strategies;Environmental   Adaptations;Habits;Interpersonal Interaction;Routines and Behaviors           Patient will benefit from skilled therapeutic intervention in order to improve the following deficits and impairments:   Body Structure / Function / Physical Skills: ADL Cognitive Skills: Attention,Emotional,Energy/Drive,Learn,Memory,Perception,Problem Solve,Safety Awareness,Temperament/Personality,Thought,Understand Psychosocial Skills: Coping Strategies,Environmental  Adaptations,Habits,Interpersonal Interaction,Routines and Behaviors   Visit Diagnosis: Difficulty coping  Frontal lobe and executive function deficit  Bipolar 2 disorder ([place])    Problem List There are no problems to display for this patient.   01/31/2021  Ponciano Ort, MOT, OTR/L  01/31/2021, 3:58 PM  [facility] HOSPITALIZATION PROGRAM [place], [place], [address] Phone: [phone]   Fax:  [phone]  Name: Savannah [name] MRN: [id number] Date of Birth: 1986-07-13

## 2021-01-31 NOTE — Progress Notes (Signed)
Spoke with patient via Webex video call, used 2 identifiers to correctly identify patient. She had a rough weekend because Friday she received an email about her medical paperwork having an issue. This triggered a bad memory for her about losing her job and now she is fearful of losing this job. She has missed a lot of work this month and is wondering how she will pay rent. Groups are going well and she plans to attend IOP after PHP. On scale 1-10 as 10 being worse she rates depression at 4 and anxiety at 7. Denies SI/HI or AV hallucinations .No issues or complaints.

## 2021-01-31 NOTE — Progress Notes (Signed)
Charting for 01/26/21: Patient was seen on 01/26/21 but was unable to chart due to patient not being admitted on schedule. She was attending PHP stating that groups were "wonderful", she has lots to read and work on. Her BP medication had been decreased since she did not need it at the dosage it was. She was becoming dizzy. Little upset about finding out she has gallstones and will be going for a scan soon. On scale 1-10 as 10 being worst she rates depression at 6 and anxiety at 6. Denies SI/HI or AV hallucinations. No side effects from medication. No issues or complaints.

## 2021-01-31 NOTE — Progress Notes (Signed)
Virtual Visit via Video Note  I connected with Savannah [name] on 01/31/21 at  9:00 AM EDT by a video enabled telemedicine application and verified that I am speaking with the correct person using two identifiers.  Location: Patient: Home  Provider: Office   I discussed the limitations of evaluation and management by telemedicine and the availability of in person appointments. The patient expressed understanding and agreed to proceed.   I discussed the assessment and treatment plan with the patient. The patient was provided an opportunity to ask questions and all were answered. The patient agreed with the plan and demonstrated an understanding of the instructions.   The patient was advised to call back or seek an in-person evaluation if the symptoms worsen or if the condition fails to improve as anticipated.  I provided 15 minutes of non-face-to-face time during this encounter.   Savannah Rack, NP   [facility]  Outpatient Program Discharge Summary  Savannah [name] [id number]  Admission date:01/18/2021 Discharge date: June 3,2022  Reason for admission: per admission assessment notes: Savannah [name] is a 35 y.o. female presents to [facility] urgent care seeking a full evaluation of her mental health.  Stated "I think I may have been on the autistic spectrum/Asperger".  Reports [facility] of bipolar disorder.  States she was prescribed  a mood stabilizer which made her depression worse. States she was diagnosed with ADHD during her childhood.  Current diagnoses major depressive disorder, anxiety and insomnia.  States she is recently changing providers to [facility].  Reported suicide attempt in 2018 where she completed a partial hospitalization program in [place].  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   Progress in Program Toward Treatment Goals: Ongoing, patient  attended and participated with daily group session with active and engaged participation.  Continues to deny suicidal or homicidal ideations.  Denies auditory or visual hallucinations.  Patient presents less hyperverbal and mood irritability.   Savannah [name] states she has been taking and tolerating Seroquel well.  States she is recently titrated her Seroquel 50 mg nightly to 75 mg.  We will make 25 mg tablets available.  Patient to stepdown to intensive outpatient programming.  On February 02, 2021  Progress (rationale): Patient to stepdown to Intensive Outpatient Programming. -Medication refill for Seroquel   Take all medications as prescribed. Keep all follow-up appointments as scheduled.  Do not consume alcohol or use illegal drugs while on prescription medications. Report any adverse effects from your medications to your primary care provider promptly.  In the event of recurrent symptoms or worsening symptoms, call 911, a crisis hotline, or go to the nearest emergency department for evaluation.   Savannah Rack, NP 01/31/2021

## 2021-02-01 ENCOUNTER — Other Ambulatory Visit (HOSPITAL_COMMUNITY): Payer: 59

## 2021-02-01 ENCOUNTER — Other Ambulatory Visit: Payer: Self-pay

## 2021-02-02 ENCOUNTER — Other Ambulatory Visit: Payer: Self-pay

## 2021-02-02 ENCOUNTER — Encounter (HOSPITAL_COMMUNITY): Payer: Self-pay

## 2021-02-02 ENCOUNTER — Other Ambulatory Visit (HOSPITAL_COMMUNITY): Payer: 59 | Admitting: Occupational Therapy

## 2021-02-02 ENCOUNTER — Telehealth (HOSPITAL_COMMUNITY): Payer: Self-pay | Admitting: Psychiatry

## 2021-02-02 ENCOUNTER — Other Ambulatory Visit (HOSPITAL_COMMUNITY): Payer: 59 | Attending: Psychiatry | Admitting: Licensed Clinical Social Worker

## 2021-02-02 DIAGNOSIS — Z9151 Personal history of suicidal behavior: Secondary | ICD-10-CM | POA: Insufficient documentation

## 2021-02-02 DIAGNOSIS — F329 Major depressive disorder, single episode, unspecified: Secondary | ICD-10-CM | POA: Insufficient documentation

## 2021-02-02 DIAGNOSIS — F419 Anxiety disorder, unspecified: Secondary | ICD-10-CM | POA: Diagnosis not present

## 2021-02-02 DIAGNOSIS — Z8659 Personal history of other mental and behavioral disorders: Secondary | ICD-10-CM | POA: Diagnosis not present

## 2021-02-02 DIAGNOSIS — F909 Attention-deficit hyperactivity disorder, unspecified type: Secondary | ICD-10-CM | POA: Insufficient documentation

## 2021-02-02 DIAGNOSIS — R4589 Other symptoms and signs involving emotional state: Secondary | ICD-10-CM

## 2021-02-02 DIAGNOSIS — R41844 Frontal lobe and executive function deficit: Secondary | ICD-10-CM

## 2021-02-02 DIAGNOSIS — G4700 Insomnia, unspecified: Secondary | ICD-10-CM | POA: Diagnosis not present

## 2021-02-02 DIAGNOSIS — F3181 Bipolar II disorder: Secondary | ICD-10-CM

## 2021-02-02 NOTE — Therapy (Signed)
**Note Savannah-Identified via Obfuscation** [place], [place], [address] Phone: [phone]   Fax:  [phone] Virtual Visit via Video Note  I connected with Savannah [name] on 02/02/21 at  10:45 AM EDT by a video enabled telemedicine application and verified that I am speaking with the correct person using two identifiers.  Location: Patient: Patient Home Provider: Clinic Office    I discussed the limitations of evaluation and management by telemedicine and the availability of in person appointments. The patient expressed understanding and agreed to proceed.   I discussed the assessment and treatment plan with the patient. The patient was provided an opportunity to ask questions and all were answered. The patient agreed with the plan and demonstrated an understanding of the instructions.   The patient was advised to call back or seek an in-person evaluation if the symptoms worsen or if the condition fails to improve as anticipated.  I provided 65 minutes of non-face-to-face time during this encounter.   Ponciano Ort, OT   Occupational Therapy Treatment  Patient Details  Name: Savannah [name] MRN: [id number] Date of Birth: Nov 20, 1985 Referring Provider (OT): Ricky Ala   Encounter Date: 02/02/2021   OT End of Session - 02/02/21 1208    Visit Number 9    Number of Visits 20    Date for OT Re-Evaluation 02/15/21    Authorization Type [employer] No Deduct Copay 20.00   Visit limit 30- 0 used   OOP 2900-223.44 met   S/w Marlowe Kays Reference#[id number]    Authorization - Number of Visits 30    OT Start Time 1045    OT Stop Time 1150    OT Time Calculation (min) 65 min    Activity Tolerance Patient tolerated treatment well    Behavior During Therapy [facility] for tasks assessed/performed;Restless           Past Medical History:  Diagnosis Date  . ADHD   . Anxiety   . Bipolar disorder ([place])   . Depression   . Diabetes mellitus,  type II ([place])   . Headache   . Hypertension   . Insomnia   . PTSD (post-traumatic stress disorder)     Past Surgical History:  Procedure Laterality Date  . TONSILLECTOMY Bilateral 2021    There were no vitals filed for this visit.   Subjective Assessment - 02/02/21 1207    Currently in Pain? No/denies             OT Education - 02/02/21 1207    Education Details Educated on strategies/tips to overcome procrastination and reviewed goal-setting using the SMART goal framework    Person(s) Educated Patient    Methods Explanation;Handout    Comprehension Verbalized understanding            OT Short Term Goals - 01/20/21 1202      OT SHORT TERM GOAL #1   Title Pt will actively engage in OT group sessions throughout duration of PHP programming, in order to promote daily structure, social engagement, and opportunities to develop and utilize adaptive strategies to maximize functional performance in preparation for safe transition and integration back into school, work, and the community.    Status On-going      OT SHORT TERM GOAL #2   Title Pt will demonstrate improved ability to communicate feelings/needs/wants, without being angry/irritable/aggressive/manic, as evidenced by, active participation in OT sessions, throughout duration of PHP programming, in order to safely transition back into the community at  discharge.    Status On-going      OT SHORT TERM GOAL #3   Title Pt will identify a minimum of two self-soothing relaxation strategies she can utilize in the moment, to safely manage increased anxiety/PTSD flashbacks, without engaging in self-harm behaviors,  in preparation for safe community reintegration.    Status On-going          Group Session:  S: "I don't set goals anymore because it's difficult to meet a goal that you don't even want to do in the first place. My parents expect me to go back to school and get a degree but that's not what I want."  O: Today's  group session encouraged engagement and participation through discussion focused on procrastination and Goal setting. Group members were introduced to goal-setting using the SMART goal framework, identifying goals as Specific, Measurable, Achievable, Relevant, and Time-Bound. Group members took time from group to create their own personal goal reflecting the SMART goal template and shared for review by peers and OT.     A: Anzleigh was active in her participation of discussion and activity, however presents distracted and frustrated at times. She shared that she does not like to set goals anymore because her whole life goals and expectations were set for her from her parents and they were never aligned with things she wanted to achieve or accomplish. She appeared somewhat receptive to education received in goal-setting including use of the SMART goal framework. Pt identified difficulty creating a goal that fit the framework though, sharing "I just need to focus on my mental health right now because it's all too much. That's all that's my goal."  P: Continue to attend PHP OT group sessions 5x week for 2 weeks to promote daily structure, social engagement, and opportunities to develop and utilize adaptive strategies to maximize functional performance in preparation for safe transition and integration back into school, work, and the community. Plan to address topic of control in next OT group session.   Plan - 02/02/21 1208    Occupational performance deficits (Please refer to evaluation for details): ADL's;IADL's;Rest and Sleep;Education;Work;Leisure;Social Participation    Body Structure / Function / Physical Skills ADL    Cognitive Skills Attention;Emotional;Energy/Drive;Learn;Memory;Perception;Problem Solve;Safety Awareness;Temperament/Personality;Thought;Understand    Psychosocial Skills Coping Strategies;Environmental  Adaptations;Habits;Interpersonal Interaction;Routines and Behaviors            Patient will benefit from skilled therapeutic intervention in order to improve the following deficits and impairments:   Body Structure / Function / Physical Skills: ADL Cognitive Skills: Attention,Emotional,Energy/Drive,Learn,Memory,Perception,Problem Solve,Safety Awareness,Temperament/Personality,Thought,Understand Psychosocial Skills: Coping Strategies,Environmental  Adaptations,Habits,Interpersonal Interaction,Routines and Behaviors   Visit Diagnosis: Difficulty coping  Frontal lobe and executive function deficit  Bipolar 2 disorder ([place])    Problem List There are no problems to display for this patient.   02/02/2021  Ponciano Ort, MOT, OTR/L  02/02/2021, 12:09 PM  [facility] HOSPITALIZATION PROGRAM [place], [place], [address] Phone: [phone]   Fax:  [phone]  Name: Savannah [name] MRN: [id number] Date of Birth: March 15, 1986

## 2021-02-02 NOTE — Telephone Encounter (Signed)
D:  Placed call to orient pt since she will be transitioning from [facility] to [facility] on 02-08-21, but there was no answer.  A:  Left vm for pt to call cm if she has any questions/concerns.

## 2021-02-03 ENCOUNTER — Encounter (HOSPITAL_COMMUNITY): Payer: Self-pay | Admitting: Family

## 2021-02-03 ENCOUNTER — Encounter (HOSPITAL_COMMUNITY): Payer: Self-pay

## 2021-02-03 ENCOUNTER — Other Ambulatory Visit: Payer: Self-pay

## 2021-02-03 ENCOUNTER — Other Ambulatory Visit (HOSPITAL_COMMUNITY): Payer: 59 | Admitting: Licensed Clinical Social Worker

## 2021-02-03 ENCOUNTER — Other Ambulatory Visit (HOSPITAL_COMMUNITY): Payer: 59 | Admitting: Occupational Therapy

## 2021-02-03 DIAGNOSIS — F3181 Bipolar II disorder: Secondary | ICD-10-CM

## 2021-02-03 DIAGNOSIS — F331 Major depressive disorder, recurrent, moderate: Secondary | ICD-10-CM

## 2021-02-03 DIAGNOSIS — R41844 Frontal lobe and executive function deficit: Secondary | ICD-10-CM

## 2021-02-03 DIAGNOSIS — F329 Major depressive disorder, single episode, unspecified: Secondary | ICD-10-CM | POA: Diagnosis not present

## 2021-02-03 DIAGNOSIS — R4589 Other symptoms and signs involving emotional state: Secondary | ICD-10-CM

## 2021-02-03 MED ORDER — QUETIAPINE FUMARATE 25 MG PO TABS: 25.0000 mg | ORAL_TABLET | Freq: Two times a day (BID) | ORAL | 0 refills | Status: DC

## 2021-02-03 NOTE — Progress Notes (Signed)
medication was refilled. Patient is now taken Seroquel  75 mg PO QHS and 25 mg PRN BID.  Will continue to titrate as tolerated.   Goal: Seroquel 25 mg po BID and Seroquel 100 mg po QHS

## 2021-02-03 NOTE — Therapy (Signed)
[place], [place], [address] Phone: [phone]   Fax:  [phone] Virtual Visit via Video Note  I connected with Savannah [name] on 02/03/21 at  11:00 AM EDT by a video enabled telemedicine application and verified that I am speaking with the correct person using two identifiers.  Location: Patient: Patient Home Provider: Clinic Office   I discussed the limitations of evaluation and management by telemedicine and the availability of in person appointments. The patient expressed understanding and agreed to proceed.   I discussed the assessment and treatment plan with the patient. The patient was provided an opportunity to ask questions and all were answered. The patient agreed with the plan and demonstrated an understanding of the instructions.   The patient was advised to call back or seek an in-person evaluation if the symptoms worsen or if the condition fails to improve as anticipated.  I provided 65 minutes of non-face-to-face time during this encounter.   Ponciano Ort, OT   Occupational Therapy Treatment  Patient Details  Name: Savannah [name] MRN: [id number] Date of Birth: 25-Sep-1985 Referring Provider (OT): Ricky Ala   Encounter Date: 02/03/2021   OT End of Session - 02/03/21 1431    Visit Number 10    Number of Visits 20    Date for OT Re-Evaluation 02/15/21    Authorization Type [employer] No Deduct Copay 20.00   Visit limit 30- 0 used   OOP 2900-223.44 met   S/w Marlowe Kays Reference#[id number]    Authorization - Number of Visits 30    OT Start Time 1100    OT Stop Time 1205    OT Time Calculation (min) 65 min    Activity Tolerance Patient tolerated treatment well    Behavior During Therapy [facility] for tasks assessed/performed;Restless           Past Medical History:  Diagnosis Date  . ADHD   . Anxiety   . Bipolar disorder ([place])   . Depression   . Diabetes mellitus,  type II ([place])   . Headache   . Hypertension   . Insomnia   . PTSD (post-traumatic stress disorder)     Past Surgical History:  Procedure Laterality Date  . TONSILLECTOMY Bilateral 2021    There were no vitals filed for this visit.   Subjective Assessment - 02/03/21 1431    Currently in Pain? No/denies           OT Education - 02/03/21 1431    Education Details Educated on brain fitness and healthy distractions as use for positive coping    Person(s) Educated Patient    Methods Explanation;Handout    Comprehension Verbalized understanding            OT Short Term Goals - 01/20/21 1202      OT SHORT TERM GOAL #1   Title Pt will actively engage in OT group sessions throughout duration of PHP programming, in order to promote daily structure, social engagement, and opportunities to develop and utilize adaptive strategies to maximize functional performance in preparation for safe transition and integration back into school, work, and the community.    Status On-going      OT SHORT TERM GOAL #2   Title Pt will demonstrate improved ability to communicate feelings/needs/wants, without being angry/irritable/aggressive/manic, as evidenced by, active participation in OT sessions, throughout duration of PHP programming, in order to safely transition back into the community at discharge.    Status  On-going      OT SHORT TERM GOAL #3   Title Pt will identify a minimum of two self-soothing relaxation strategies she can utilize in the moment, to safely manage increased anxiety/PTSD flashbacks, without engaging in self-harm behaviors,  in preparation for safe community reintegration.    Status On-going         Group Session:  S: "I like to do sudoku, does that count?"  O: Group encouraged increased social engagement and participation through discussion/activity focused on brain fitness. Patients were provided education on various brain fitness activities/strategies, with explanation  provided on the qualifying factors including: one, that is has to be challenging/hard and two, it has to be something that you do not do every day. Patients engaged actively during group session in various brain fitness activities to increase attention, concentration, and problem-solving skills. Discussion followed with a focus on identifying the benefits of brain fitness activities as use for adaptive coping strategies and distraction.    A: Savannah [name] was active in her participation of discussion, however was intermittently engaged in activity, appearing distracted and preoccupied with other activities/tasks. Pt shared during activity that she 'hates stuff like this" and was intermittently engaged for the remainder. Pt did share that she likes to engage in sudoku as a brain fitness activity. Appeared minimally receptive to use of brain fitness activities as a positive distraction and coping strategy.   P: Continue to attend PHP OT group sessions 5x week for 2 weeks to promote daily structure, social engagement, and opportunities to develop and utilize adaptive strategies to maximize functional performance in preparation for safe transition and integration back into school, work, and the community.    Plan - 02/03/21 1431    Occupational performance deficits (Please refer to evaluation for details): ADL's;IADL's;Rest and Sleep;Education;Work;Leisure;Social Participation    Body Structure / Function / Physical Skills ADL    Cognitive Skills Attention;Emotional;Energy/Drive;Learn;Memory;Perception;Problem Solve;Safety Awareness;Temperament/Personality;Thought;Understand    Psychosocial Skills Coping Strategies;Environmental  Adaptations;Habits;Interpersonal Interaction;Routines and Behaviors           Patient will benefit from skilled therapeutic intervention in order to improve the following deficits and impairments:   Body Structure / Function / Physical Skills: ADL Cognitive Skills:  Attention,Emotional,Energy/Drive,Learn,Memory,Perception,Problem Solve,Safety Awareness,Temperament/Personality,Thought,Understand Psychosocial Skills: Coping Strategies,Environmental  Adaptations,Habits,Interpersonal Interaction,Routines and Behaviors   Visit Diagnosis: Difficulty coping  Frontal lobe and executive function deficit  Bipolar 2 disorder ([place])    Problem List There are no problems to display for this patient.   02/03/2021  Ponciano Ort, MOT, OTR/L  02/03/2021, 2:32 PM  [facility] HOSPITALIZATION PROGRAM [place], [place], [address] Phone: [phone]   Fax:  [phone]  Name: Savannah [name] MRN: [id number] Date of Birth: 03-May-1986

## 2021-02-06 ENCOUNTER — Other Ambulatory Visit: Payer: Self-pay

## 2021-02-06 ENCOUNTER — Other Ambulatory Visit (HOSPITAL_COMMUNITY): Payer: 59 | Admitting: Licensed Clinical Social Worker

## 2021-02-06 ENCOUNTER — Other Ambulatory Visit (HOSPITAL_COMMUNITY): Payer: 59 | Admitting: Occupational Therapy

## 2021-02-06 ENCOUNTER — Encounter (HOSPITAL_COMMUNITY): Payer: Self-pay

## 2021-02-06 ENCOUNTER — Encounter (HOSPITAL_COMMUNITY): Payer: Self-pay | Admitting: Family

## 2021-02-06 DIAGNOSIS — F3181 Bipolar II disorder: Secondary | ICD-10-CM

## 2021-02-06 DIAGNOSIS — R41844 Frontal lobe and executive function deficit: Secondary | ICD-10-CM

## 2021-02-06 DIAGNOSIS — F331 Major depressive disorder, recurrent, moderate: Secondary | ICD-10-CM

## 2021-02-06 DIAGNOSIS — F329 Major depressive disorder, single episode, unspecified: Secondary | ICD-10-CM | POA: Diagnosis not present

## 2021-02-06 DIAGNOSIS — R4589 Other symptoms and signs involving emotional state: Secondary | ICD-10-CM

## 2021-02-06 MED ORDER — QUETIAPINE FUMARATE 100 MG PO TABS: 100.0000 mg | ORAL_TABLET | Freq: Two times a day (BID) | ORAL | 0 refills | Status: DC

## 2021-02-06 MED ORDER — DULOXETINE HCL 20 MG PO CPEP: 20.0000 mg | ORAL_CAPSULE | Freq: Every day | ORAL | 0 refills | Status: DC

## 2021-02-06 NOTE — Progress Notes (Signed)
Virtual Visit via Video Note  I connected with Savannah [name] on 02/06/21 at  9:00 AM EDT by a video enabled telemedicine application and verified that I am speaking with the correct person using two identifiers.  Location: Patient: Home Provider: Office   I discussed the limitations of evaluation and management by telemedicine and the availability of in person appointments. The patient expressed understanding and agreed to proceed.   I discussed the assessment and treatment plan with the patient. The patient was provided an opportunity to ask questions and all were answered. The patient agreed with the plan and demonstrated an understanding of the instructions.   The patient was advised to call back or seek an in-person evaluation if the symptoms worsen or if the condition fails to improve as anticipated.  I provided 15 minutes of non-face-to-face time during this encounter.   Savannah Rack, NP   [facility] Discharge Summary  Savannah [name] [id number]  Admission date: 01/18/2021 Discharge date: February 08, 2021  Reason for admission:per admission assessment notes: Savannah [name] a 35 y.o.femalepresents to [facility] care seeking a full evaluation of her mental health.Stated "I think I may have been on the autistic spectrum/Asperger".Reports she [facility] with a diagnosis of bipolar disorder. States she wasprescribed a mood stabilizer which made her depression worse. States she was diagnosed with ADHD during her childhood. Current diagnoses major depressive disorder, anxiety and insomnia. States she is recently changingproviders to [employer]. Reported suicide attempt in 2018 where she completed a partial hospitalization program in [place]. 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   Progress in Program Toward Treatment Goals:   Ongoing, Savannah [name] reports symptoms of worries related to financial strain.  States she has concerns with not being able to afford her rent payment this month due to being out of work.  She denied suicidal or homicidal ideations.  Denies auditory or visual hallucinations.  Continues to endorse high anxiety, depression and mood lability.  Patient recently titrated her Seroquel 75 mg to 100 mg p.o. nightly.  And is requesting initiate antidepressant.  Patient to start Cymbalta 25 mg p.o. daily and continue Seroquel 100 mg p.o. nightly.  Patient was receptive to plan.  Patient to stepdown to intensive outpatient programming.  Support, encouragement and  reassurance was provided   Progress (rationale): Patient is stepping down to Intensive Outpatient [facility] (IOP)   Take all medications as prescribed. Keep all follow-up appointments as scheduled.  Do not consume alcohol or use illegal drugs while on prescription medications. Report any adverse effects from your medications to your primary care provider promptly.  In the event of recurrent symptoms or worsening symptoms, call 911, a crisis hotline, or go to the nearest emergency department for evaluation.    Savannah Rack, NP 02/06/2021

## 2021-02-06 NOTE — Therapy (Signed)
[place], [place], [address] Phone: [phone]   Fax:  [phone]  Virtual Visit via Video Note  I connected with Oren Section on 02/06/21 at  11:15 AM EDT by a video enabled telemedicine application and verified that I am speaking with the correct person using two identifiers.  Location: Patient: Patient Home Provider: Clinic Office   I discussed the limitations of evaluation and management by telemedicine and the availability of in person appointments. The patient expressed understanding and agreed to proceed.  I discussed the assessment and treatment plan with the patient. The patient was provided an opportunity to ask questions and all were answered. The patient agreed with the plan and demonstrated an understanding of the instructions.   The patient was advised to call back or seek an in-person evaluation if the symptoms worsen or if the condition fails to improve as anticipated.  I provided 60 minutes of non-face-to-face time during this encounter.   Ponciano Ort, OT   Occupational Therapy Treatment  Patient Details  Name: Sarabella Caprio MRN: [id number] Date of Birth: Feb 06, 1986 Referring Provider (OT): Ricky Ala   Encounter Date: 02/06/2021   OT End of Session - 02/06/21 1327    Visit Number 11    Number of Visits 20    Date for OT Re-Evaluation 02/15/21    Authorization Type [employer] No Deduct Copay 20.00   Visit limit 30- 0 used   OOP 2900-223.44 met   S/w Marlowe Kays Reference#[id number]    Authorization - Number of Visits 30    OT Start Time 1115    OT Stop Time 1215    OT Time Calculation (min) 60 min    Activity Tolerance Patient tolerated treatment well    Behavior During Therapy [facility] for tasks assessed/performed;Restless           Past Medical History:  Diagnosis Date  . ADHD   . Anxiety   . Bipolar disorder ([place])   . Depression   . Diabetes mellitus,  type II ([place])   . Headache   . Hypertension   . Insomnia   . PTSD (post-traumatic stress disorder)     Past Surgical History:  Procedure Laterality Date  . TONSILLECTOMY Bilateral 2021    There were no vitals filed for this visit.   Subjective Assessment - 02/06/21 1327    Currently in Pain? No/denies           OT Education - 02/06/21 1327    Education Details Educated on tips and strategies to improve overall self-care    Person(s) Educated Patient    Methods Explanation;Handout    Comprehension Verbalized understanding            OT Short Term Goals - 01/20/21 1202      OT SHORT TERM GOAL #1   Title Pt will actively engage in OT group sessions throughout duration of PHP programming, in order to promote daily structure, social engagement, and opportunities to develop and utilize adaptive strategies to maximize functional performance in preparation for safe transition and integration back into school, work, and the community.    Status On-going      OT SHORT TERM GOAL #2   Title Pt will demonstrate improved ability to communicate feelings/needs/wants, without being angry/irritable/aggressive/manic, as evidenced by, active participation in OT sessions, throughout duration of PHP programming, in order to safely transition back into the community at discharge.    Status On-going  OT SHORT TERM GOAL #3   Title Pt will identify a minimum of two self-soothing relaxation strategies she can utilize in the moment, to safely manage increased anxiety/PTSD flashbacks, without engaging in self-harm behaviors,  in preparation for safe community reintegration.    Status On-going         Group Session:  S: None noted*  O: Group began with a warm-up and reflected on the events from their weekend. Group members were encouraged to identify one self-care activity they engaged in over the weekend. Today's group session focused on topic of self-care and group members identified their  definitions of what self-care means to them, along with recognizing the difference between self-care and selfishness. Members worked through all five subcategories of self-care including physical, emotional/psychological, social, spiritual, and professional self-care. Discussion focused on members sharing which areas they need improvement in and which areas they identified as strengths. Members worked together to [employer] and tips on improving one's self-care with the thought that what someone identifies as their "best self" changes day-to-day contingent upon mood, emotions, and situations  A: Jahlisa was moderately engaged and independent in her participation of discussion and activity, sharing that over the weekend one thing she did for self-care was purchase a pill organize and organize/refill her medications. Pt appeared receptive to education and information received during discussion and identified current self-care strengths as doing yoga and organizing/taking her medications as prescribed and scheduled. She shared that the visual cue is a helpful reminder to double check that she took it. Pt identified areas of improvement as meeting new people.   P: Continue to attend PHP OT group sessions 5x week for 2 weeks to promote daily structure, social engagement, and opportunities to develop and utilize adaptive strategies to maximize functional performance in preparation for safe transition and integration back into school, work, and the community. Plan to address topic of control in next OT group session.    Plan - 02/06/21 1327    Occupational performance deficits (Please refer to evaluation for details): ADL's;IADL's;Rest and Sleep;Education;Work;Leisure;Social Participation    Body Structure / Function / Physical Skills ADL    Cognitive Skills Attention;Emotional;Energy/Drive;Learn;Memory;Perception;Problem Solve;Safety Awareness;Temperament/Personality;Thought;Understand    Psychosocial  Skills Coping Strategies;Environmental  Adaptations;Habits;Interpersonal Interaction;Routines and Behaviors           Patient will benefit from skilled therapeutic intervention in order to improve the following deficits and impairments:   Body Structure / Function / Physical Skills: ADL Cognitive Skills: Attention,Emotional,Energy/Drive,Learn,Memory,Perception,Problem Solve,Safety Awareness,Temperament/Personality,Thought,Understand Psychosocial Skills: Coping Strategies,Environmental  Adaptations,Habits,Interpersonal Interaction,Routines and Behaviors   Visit Diagnosis: Difficulty coping  Frontal lobe and executive function deficit  Bipolar 2 disorder ([place])    Problem List There are no problems to display for this patient.   02/06/2021  Ponciano Ort, MOT, OTR/L 02/06/2021, 1:28 PM  [facility] HOSPITALIZATION PROGRAM [place], [place], [address] Phone: [phone]   Fax:  [phone]  Name: Delisha Peaden MRN: [id number] Date of Birth: [birth year]

## 2021-02-07 ENCOUNTER — Encounter (HOSPITAL_COMMUNITY): Payer: Self-pay

## 2021-02-07 ENCOUNTER — Telehealth (HOSPITAL_COMMUNITY): Payer: Self-pay | Admitting: Psychiatry

## 2021-02-07 ENCOUNTER — Other Ambulatory Visit: Payer: Self-pay

## 2021-02-07 ENCOUNTER — Other Ambulatory Visit (HOSPITAL_COMMUNITY): Payer: 59 | Admitting: Licensed Clinical Social Worker

## 2021-02-07 ENCOUNTER — Other Ambulatory Visit (HOSPITAL_COMMUNITY): Payer: 59 | Admitting: Occupational Therapy

## 2021-02-07 DIAGNOSIS — F3181 Bipolar II disorder: Secondary | ICD-10-CM

## 2021-02-07 DIAGNOSIS — F329 Major depressive disorder, single episode, unspecified: Secondary | ICD-10-CM | POA: Diagnosis not present

## 2021-02-07 DIAGNOSIS — R41844 Frontal lobe and executive function deficit: Secondary | ICD-10-CM

## 2021-02-07 DIAGNOSIS — R4589 Other symptoms and signs involving emotional state: Secondary | ICD-10-CM

## 2021-02-07 NOTE — Telephone Encounter (Signed)
D:  Pt states she can't start MH-IOP tomorrow d/t having a scan of her gallbladder done.  A:  Pt will start MH-IOP on 02-09-21 instead.  Inform treatment team.  R:  Pt receptive.

## 2021-02-07 NOTE — Therapy (Signed)
[place], [place], [address] Phone: [phone]   Fax:  [phone] Virtual Visit via Video Note  I connected with Oren Section on 02/07/21 at  8:00 AM EDT by a video enabled telemedicine application and verified that I am speaking with the correct person using two identifiers.  Location: Patient: Patient Home Provider: Clinic Office   I discussed the limitations of evaluation and management by telemedicine and the availability of in person appointments. The patient expressed understanding and agreed to proceed.   I discussed the assessment and treatment plan with the patient. The patient was provided an opportunity to ask questions and all were answered. The patient agreed with the plan and demonstrated an understanding of the instructions.   The patient was advised to call back or seek an in-person evaluation if the symptoms worsen or if the condition fails to improve as anticipated.  I provided 60 minutes of non-face-to-face time during this encounter.   Ponciano Ort, OT   Occupational Therapy Treatment  Patient Details  Name: Mckaylee Dimalanta MRN: [id number] Date of Birth: 17-Apr-1986 Referring Provider (OT): Ricky Ala   Encounter Date: 02/07/2021   OT End of Session - 02/07/21 1527    Visit Number 12    Number of Visits 20    Date for OT Re-Evaluation 02/15/21    Authorization Type [employer] No Deduct Copay 20.00   Visit limit 30- 0 used   OOP 2900-223.44 met   S/w Marlowe Kays Reference#[id number]    Authorization - Number of Visits 30    OT Start Time 4158    OT Stop Time 1205    OT Time Calculation (min) 60 min    Activity Tolerance Patient tolerated treatment well    Behavior During Therapy [facility] for tasks assessed/performed;Restless           Past Medical History:  Diagnosis Date  . ADHD   . Anxiety   . Bipolar disorder ([place])   . Depression   . Diabetes mellitus,  type II ([place])   . Headache   . Hypertension   . Insomnia   . PTSD (post-traumatic stress disorder)     Past Surgical History:  Procedure Laterality Date  . TONSILLECTOMY Bilateral 2021    There were no vitals filed for this visit.   Subjective Assessment - 02/07/21 1526    Currently in Pain? No/denies           OT Education - 02/07/21 1526    Education Details Educated on identifying worry and utilized circle on control tool to categorize what we do and do not have control over    Person(s) Educated Patient    Methods Explanation;Handout    Comprehension Verbalized understanding            OT Short Term Goals - 02/07/21 1527      OT SHORT TERM GOAL #1   Title Pt will actively engage in OT group sessions throughout duration of PHP programming, in order to promote daily structure, social engagement, and opportunities to develop and utilize adaptive strategies to maximize functional performance in preparation for safe transition and integration back into school, work, and the community.    Status Achieved      OT SHORT TERM GOAL #2   Title Pt will demonstrate improved ability to communicate feelings/needs/wants, without being angry/irritable/aggressive/manic, as evidenced by, active participation in OT sessions, throughout duration of PHP programming, in order to safely transition back  into the community at discharge.    Status Partially Met      OT SHORT TERM GOAL #3   Title Pt will identify a minimum of two self-soothing relaxation strategies she can utilize in the moment, to safely manage increased anxiety/PTSD flashbacks, without engaging in self-harm behaviors,  in preparation for safe community reintegration.    Status Achieved         Group Session:  S: "I can't make a decision about anything and that's when I feel out of control, because I react immediately or don't do anything."  O: Group session encouraged increased participation and engagement through  discussion focused on worry and our circle of control. Group reviewed a powerpoint that discussed healthy vs unhealthy worry with specific examples provided. Discussion also focused on utilizing the circle of control outline to identify what is within our control, what we have influence on, and what is not in our control. Group members shared specific examples and worries and identified what categories they fell in within the circle of control.   A: Jina was active in her participation of discussion and activity, appeared to follow along in discussion and shared that when she feels out of control she will either react immediately or not do anything. Appeared mostly receptive to feedback and use of circle of control tool to manage her worries and sense of feeling out of control.   P: Pt will discharge from PHP effective today with plan to follow up and begin MHIOP tomorrow 02/08/2021.   OCCUPATIONAL THERAPY DISCHARGE SUMMARY  Visits from Start of Care: 12  Current functional level related to goals / functional outcomes: Shellie has met 2/3 OT goals and partially met 1/3 OT goals during her time in PHP. Pt has successfully engaged in and attended groups, however continues to struggle to communicate with her family and advocate for her needs. Pt is ready and agreeable to discharge today, 02/07/2021 with plan to follow up and begin [place] 02/08/2021 for ongoing therapy and treatment needs.    Remaining deficits: See above   Education / Equipment: See above  Plan: Patient agrees to discharge.  Patient goals were partially met. Patient is being discharged due to meeting the stated rehab goals.  ?????       Plan - 02/07/21 1527    Occupational performance deficits (Please refer to evaluation for details): ADL's;IADL's;Rest and Sleep;Education;Work;Leisure;Social Participation    Body Structure / Function / Physical Skills ADL    Cognitive Skills  Attention;Emotional;Energy/Drive;Learn;Memory;Perception;Problem Solve;Safety Awareness;Temperament/Personality;Thought;Understand    Psychosocial Skills Coping Strategies;Environmental  Adaptations;Habits;Interpersonal Interaction;Routines and Behaviors           Patient will benefit from skilled therapeutic intervention in order to improve the following deficits and impairments:   Body Structure / Function / Physical Skills: ADL Cognitive Skills: Attention,Emotional,Energy/Drive,Learn,Memory,Perception,Problem Solve,Safety Awareness,Temperament/Personality,Thought,Understand Psychosocial Skills: Coping Strategies,Environmental  Adaptations,Habits,Interpersonal Interaction,Routines and Behaviors   Visit Diagnosis: Difficulty coping  Frontal lobe and executive function deficit  Bipolar 2 disorder ([place])    Problem List There are no problems to display for this patient.   02/07/2021  Ponciano Ort, MOT, OTR/L  02/07/2021, 3:27 PM  [facility] HOSPITALIZATION PROGRAM [place], [place], [address] Phone: [phone]   Fax:  [phone]  Name: Teleshia Lemere MRN: [id number] Date of Birth: 01-Mar-1986

## 2021-02-07 NOTE — Progress Notes (Signed)
Spoke with patient via Webex video call, used 2 identifiers to correctly identify patient. States that she is still waiting on her medical paperwork to go through for work and that causes her stress. She is being discharged from [facility] today and will attend IOP next week. She has enjoyed groups and learned a lot. Looks forward to continuing with IOP. On scale 1-10 as 10 being worst she rates depression at 5 and anxiety at 7. Denies SI/HI or AV hallucinations. PHQ9=18. No issues or complaints. No side effects from medications.

## 2021-02-08 ENCOUNTER — Ambulatory Visit (HOSPITAL_COMMUNITY): Payer: 59

## 2021-02-08 ENCOUNTER — Other Ambulatory Visit (HOSPITAL_COMMUNITY): Payer: 59

## 2021-02-09 ENCOUNTER — Other Ambulatory Visit (HOSPITAL_COMMUNITY): Payer: 59

## 2021-02-09 ENCOUNTER — Other Ambulatory Visit (HOSPITAL_COMMUNITY): Payer: 59 | Admitting: Psychiatry

## 2021-02-09 ENCOUNTER — Ambulatory Visit (HOSPITAL_COMMUNITY): Payer: 59

## 2021-02-09 ENCOUNTER — Encounter (HOSPITAL_COMMUNITY): Payer: Self-pay | Admitting: Psychiatry

## 2021-02-09 ENCOUNTER — Other Ambulatory Visit: Payer: Self-pay

## 2021-02-09 DIAGNOSIS — F3181 Bipolar II disorder: Secondary | ICD-10-CM | POA: Diagnosis present

## 2021-02-09 DIAGNOSIS — F329 Major depressive disorder, single episode, unspecified: Secondary | ICD-10-CM | POA: Diagnosis not present

## 2021-02-09 DIAGNOSIS — Z8659 Personal history of other mental and behavioral disorders: Secondary | ICD-10-CM | POA: Diagnosis not present

## 2021-02-09 DIAGNOSIS — G4700 Insomnia, unspecified: Secondary | ICD-10-CM | POA: Diagnosis not present

## 2021-02-09 DIAGNOSIS — Z9151 Personal history of suicidal behavior: Secondary | ICD-10-CM | POA: Diagnosis not present

## 2021-02-09 DIAGNOSIS — F419 Anxiety disorder, unspecified: Secondary | ICD-10-CM | POA: Diagnosis not present

## 2021-02-09 DIAGNOSIS — F909 Attention-deficit hyperactivity disorder, unspecified type: Secondary | ICD-10-CM | POA: Diagnosis not present

## 2021-02-09 NOTE — Progress Notes (Signed)
Virtual Visit via Video Note  I connected with Savannah [name] on @TODAY @ at  9:00 AM EDT by a video enabled telemedicine application and verified that I am speaking with Savannah correct person using two identifiers.  Location: Patient:  at home Provider: at office   I discussed Savannah limitations of evaluation and management by telemedicine and Savannah availability of in person appointments. Savannah patient expressed understanding and agreed to proceed.   I discussed Savannah assessment and treatment plan with Savannah patient. Savannah patient was provided an opportunity to ask questions and all were answered. Savannah patient agreed with Savannah plan and demonstrated an understanding of Savannah instructions.   Savannah patient was advised to call back or seek an in-person evaluation if Savannah symptoms worsen or if Savannah condition fails to improve as anticipated.  I provided 20 minutes of non-face-to-face time during this encounter.   Savannah [name], [name], M.Ed,CNA   D:  As per admission assessment notes: Savannah [name] is a 35 y.o. female presents to [facility] urgent care seeking a full evaluation of her mental health.  Stated "I think I may have been on Savannah autistic spectrum/Asperger".  Reports she [facility] with a diagnosis of bipolar disorder.  States she was prescribed  a mood stabilizer which made her depression worse. States she was diagnosed with ADHD during her childhood.  Current diagnoses major depressive disorder, anxiety and insomnia.  States she is recently changing providers to [facility].  Reported suicide attempt in 2018 where she completed a partial hospitalization program in 2019.  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  Pt transitioned from PHP to MH-IOP today.  Attended PHP from 01-18-21 thru 02-08-21.  Reports that Savannah groups were helpful.  On a scale of 1-10 (10 being Savannah worst); pt rates her depression and anxiety at a 6.  Denies SI/HI or A/V  hallucinations.  Continues to c/o of worrying about her finances.  A:  Oriented pt to virtual MH-IOP.  Pt gave verbal consent for treatment, to release chart information to referred providers and to complete any forms if needed.  Pt also gave consent for attending group virtually d/t COVID-19 social distancing restrictions.  Encouraged support groups thru 09-24-1979.  F/U with [facility].  R:  Pt receptive.  Savannah [name], M.Ed,CNA

## 2021-02-10 ENCOUNTER — Other Ambulatory Visit (HOSPITAL_COMMUNITY): Payer: 59

## 2021-02-10 ENCOUNTER — Encounter (HOSPITAL_COMMUNITY): Payer: Self-pay

## 2021-02-10 ENCOUNTER — Other Ambulatory Visit: Payer: Self-pay

## 2021-02-10 ENCOUNTER — Other Ambulatory Visit (HOSPITAL_COMMUNITY): Payer: 59 | Admitting: Psychiatry

## 2021-02-10 ENCOUNTER — Encounter (HOSPITAL_COMMUNITY): Payer: Self-pay | Admitting: Psychiatry

## 2021-02-10 ENCOUNTER — Ambulatory Visit (HOSPITAL_COMMUNITY): Payer: 59

## 2021-02-10 DIAGNOSIS — F329 Major depressive disorder, single episode, unspecified: Secondary | ICD-10-CM | POA: Diagnosis not present

## 2021-02-10 DIAGNOSIS — F3181 Bipolar II disorder: Secondary | ICD-10-CM

## 2021-02-10 NOTE — Progress Notes (Signed)
Virtual Visit via Video Note  I connected with Savannah [name] on 02/10/21 at  9:00 AM EDT by a video enabled telemedicine application and verified that I am speaking with the correct person using two identifiers.  At orientation to the IOP program, Case Manager discussed the limitations of evaluation and management by telemedicine and the availability of in person appointments. The patient expressed understanding and agreed to proceed with virtual visits throughout the duration of the program.   Location:  Patient: Patient Home Provider: Home Office   History of Present Illness: Bipolar 2 DO  Observations/Objective: Check In: Case Manager checked in with all participants to review discharge dates, insurance authorizations, work-related documents and needs from the treatment team regarding medications. Client stated needs and engaged in discussion.   Initial Therapeutic Activity: Counselor facilitated a check-in with group members to assess mood and current functioning. Client shared details of their mental health management since our last session, including challenges and successes. Counselor engaged group in discussion, covering the following topics: coping strategies for work related stress, outdoor activities to combat depression, grounding exercises, assertive communication of needs, changing up application of copings strategies and discharge planning. Client presents with moderate depression and moderate anxiety. Client denied any current SI/HI/psychosis.  Second Therapeutic Activity: Counselor introduced [employer], representative with [employer] to share about programming. Group Members asked questions and engaged in discussion, as Trinna Post shared about Peer Support, Support Groups and the [employer]. Client stated that they are interested in connecting with the [facility] and [facility] [place] group.  Check Out: Counselor closed program by allowing time to celebrate 2  graduating group members. Counselor shared reflections on progress and allow space for group members to share well wishes and encouragements for the graduating client. Counselor prompted graduating client to share takeaways, reflect on progress and final thoughts for the group.Counselor prompted group members to share what self-care practice or productivity activity they will engage in today. Group members shared their plans with the group. Client endorsed safety plan to be followed to prevent safety issues.   Assessment and Plan: Clinician recommends that Client remain in IOP treatment to better manage mental health symptoms, stabilization and to address treatment plan goals. Clinician recommends adherence to crisis/safety plan, taking medications as prescribed, and following up with medical professionals if any issues arise.    Follow Up Instructions: Clinician will send Webex link for next session. The Client was advised to call back or seek an in-person evaluation if the symptoms worsen or if the condition fails to improve as anticipated.     I provided 180 minutes of non-face-to-face time during this encounter.     Hilbert Odor, LCSW

## 2021-02-10 NOTE — Psych (Signed)
Virtual Visit via Video Note  I connected with Savannah [name] on 01/19/21 at  9:00 AM EDT by a video enabled telemedicine application and verified that I am speaking with the correct person using two identifiers.  Location: Patient: patient home Provider: clinical home office   I discussed the limitations of evaluation and management by telemedicine and the availability of in person appointments. The patient expressed understanding and agreed to proceed.  I discussed the assessment and treatment plan with the patient. The patient was provided an opportunity to ask questions and all were answered. The patient agreed with the plan and demonstrated an understanding of the instructions.   The patient was advised to call back or seek an in-person evaluation if the symptoms worsen or if the condition fails to improve as anticipated.  Pt was provided 45 minutes of non-face-to-face time during this encounter.   [name], LCSW    [facility] PHP THERAPIST PROGRESS NOTE  Savannah [name] [id number]  Session Time: 9:00 - 10:00  Participation Level: Active  Behavioral Response: CasualAlertDepressed  Type of Therapy: Group Therapy  Treatment Goals addressed: Coping   Interventions: CBT, DBT, Supportive and Reframing   Summary: Clinician led check-in regarding current stressors and situation. Clinician utilized active listening and empathetic response and validated patient emotions. Clinician facilitated processing group on pertinent issues.    Therapist Response: Savannah [name] is a 35 y.o. female who presents with mood symptoms. Patient arrived within time allowed and reports that she is feeling "weird." Patient rates her mood at a 4 on a scale of 1-10 with 10 being great. Pt reports she feels like a "zombie" and feels sedated from her night medication. Pt reports sleeping most of the day and night. Pt choose to leave group at 9:45 stating she cannot be present and needs to sleep  more.      Suicidal/Homicidal: Nowithout intent/plan  Plan: Pt will continue in PHP while working to increase emotional regulation and healthy coping ability.   Diagnosis: Bipolar 2 disorder (HCC) [F31.81]    1. Bipolar 2 disorder (HCC)       [name], LCSW 02/10/2021

## 2021-02-10 NOTE — Progress Notes (Signed)
Virtual Visit via Video Note  I connected with Savannah [name] on 02/09/21 at  9:00 AM EDT by a video enabled telemedicine application and verified that I am speaking with the correct person using two identifiers.  At orientation to the IOP program, Case Manager discussed the limitations of evaluation and management by telemedicine and the availability of in person appointments. The patient expressed understanding and agreed to proceed with virtual visits throughout the duration of the program.   Location:  Patient: Patient Home Provider: Home Office   History of Present Illness: Bipolar 2 DO  Observations/Objective: Check In: Case Manager checked in with all participants to review discharge dates, insurance authorizations, work-related documents and needs from the treatment team regarding medications. Client stated needs and engaged in discussion.   Initial Therapeutic Activity: Counselor facilitated a check-in with group members to assess mood and current functioning. Client shared details of their mental health management since our last session, including challenges and successes. Counselor engaged group in discussion, covering the following topics: self-care practices, initiating new relationships, gradual exposure, support groups, and therapy animals. Client presents with moderate depression and moderate anxiety. Client denied any current SI/HI/psychosis.  Second Therapeutic Activity: Counselor introduced [place], [place] Chaplain to present information and discussion on Grief and Loss. Group members engaged in discussion, sharing how grief impacts them, what comforts them, what emotions are felt, labeling losses, etc. After guest speaker logged off, Counselor prompted group to spend 10-15 minutes journaling to process personal grief and loss situations. Counselor processed entries with group and client's identified areas for additional processing in individual therapy. Client noted  grief and loss related to stage of life and work challenges.  Check Out: Counselor prompted group members to share what self-care practice or productivity activity they will engage in today. Group members shared their plans with the group. Client endorsed safety plan to be followed to prevent safety issues.   Assessment and Plan: Clinician recommends that Client remain in IOP treatment to better manage mental health symptoms, stabilization and to address treatment plan goals. Clinician recommends adherence to crisis/safety plan, taking medications as prescribed, and following up with medical professionals if any issues arise.    Follow Up Instructions: Clinician will send Webex link for next session. The Client was advised to call back or seek an in-person evaluation if the symptoms worsen or if the condition fails to improve as anticipated.     I provided 180 minutes of non-face-to-face time during this encounter.     Hilbert Odor, LCSW

## 2021-02-11 NOTE — Psych (Signed)
Virtual Visit via Video Note  I connected with Savannah [name] on 02/07/21 at  9:00 AM EDT by a video enabled telemedicine application and verified that I am speaking with the correct person using two identifiers.  Location: Patient: patient home Provider: clinical home office   I discussed the limitations of evaluation and management by telemedicine and the availability of in person appointments. The patient expressed understanding and agreed to proceed.  I discussed the assessment and treatment plan with the patient. The patient was provided an opportunity to ask questions and all were answered. The patient agreed with the plan and demonstrated an understanding of the instructions.   The patient was advised to call back or seek an in-person evaluation if the symptoms worsen or if the condition fails to improve as anticipated.  Pt was provided 240 minutes of non-face-to-face time during this encounter.   [name], LCSW    [facility] PHP THERAPIST PROGRESS NOTE  Darcie Mellone [id number]  Session Time: 9:00 - 10:00  Participation Level: Active  Behavioral Response: CasualAlertDepressed  Type of Therapy: Group Therapy  Treatment Goals addressed: Coping  Interventions: CBT, DBT, Supportive and Reframing   Summary: Clinician led check-in regarding current stressors and situation. Clinician utilized active listening and empathetic response and validated patient emotions. Clinician facilitated processing group on pertinent issues.    Therapist Response: Savannah [name] is a 35 y.o. female who presents with mood symptoms. Patient arrived within time allowed and reports that she is feeling "okay" Patient rates her mood at a 5 on a scale of 1-10 with 10 being great. Pt reports she practiced the temperature skill after group and it helped "reset" her afternoon. Pt states the day turned out well and she got out of the house and journaled. Pt able to process. Pt engaged in  discussion.         Session Time: 10:00 -11:00   Participation Level: Active   Behavioral Response: CasualAlertDepressed   Type of Therapy: Group therapy   Treatment Goals addressed: Coping   Interventions: CBT, DBT, Supportive and Reframing   Summary: Cln led discussion on impulsivity. Group discussed struggles with impulsivity. Cln highlighted theme of immediacy. Group built insight around the way immediacy interacts with impulsivity. Cln encouraged pt's to consider mantras and grounding statements to remind themselves there is time to think/feel/act.       Therapist Response:  Pt engaged in discussion and is able to make connections and gain insight.       Session Time: 11:00- 12:00   Participation Level: Active   Behavioral Response: CasualAlertDepressed   Type of Therapy: Group Therapy, OT   Treatment Goals addressed: Coping   Interventions: Psychosocial skills training, Supportive   Summary: Occupational Therapy group   Therapist Response: Patient engaged in group. See OT note.           Session Time: 12:00 -1:00   Participation Level: Active   Behavioral Response: CasualAlertDepressed   Type of Therapy: Group therapy   Treatment Goals addressed: Coping   Interventions: CBT; Solution focused; Supportive; Reframing   Summary: 12:00 - 12:50: Cln continued topic of DBT distress tolerance skills. Cln introduced STOP skill and group discussed ways they could apply the STOP skill in their every day life.  12:50 -1:00 Clinician led check-out. Clinician assessed for immediate needs, medication compliance and efficacy, and safety concerns   Therapist Response: 12:50 - 1:00: Pt engaged in discussion and reports she can apply STOP when feeling impulsive at work.  12:50 - 1:00: At check-out, patient rates her mood at a 5 on a scale of 1-10 with 10 being great. Pt reports afternoon plans of getting out of the house. Pt demonstrates some progress as evidenced by  utilizing distraction skills outside of group. Patient denies SI/HI at the end of group.    Suicidal/Homicidal: Nowithout intent/plan  Plan: Pt will discharge from PHP due to meeting treatment goals of increased emotional regulation and healthy coping ability. Pt will step-down to IOP within this agency on 02/08/21. Pt and provider are aligned with discharge. Pt denies SI/HI at time of discharge.   Diagnosis: Bipolar 2 disorder (HCC) [F31.81]    1. Bipolar 2 disorder (HCC)       [name], LCSW 02/11/2021

## 2021-02-11 NOTE — Psych (Signed)
Virtual Visit via Video Note  I connected with Savannah [name] on 01/23/21 at  9:00 AM EDT by a video enabled telemedicine application and verified that I am speaking with the correct person using two identifiers.  Location: Patient: patient home Provider: clinical home office   I discussed the limitations of evaluation and management by telemedicine and the availability of in person appointments. The patient expressed understanding and agreed to proceed.  I discussed the assessment and treatment plan with the patient. The patient was provided an opportunity to ask questions and all were answered. The patient agreed with the plan and demonstrated an understanding of the instructions.   The patient was advised to call back or seek an in-person evaluation if the symptoms worsen or if the condition fails to improve as anticipated.  Pt was provided 240 minutes of non-face-to-face time during this encounter.   [name], LCSW    [facility] PHP THERAPIST PROGRESS NOTE  Savannah [name] [id number]  Session Time: 9:00 - 10:00  Participation Level: Active  Behavioral Response: CasualAlertDepressed  Type of Therapy: Group Therapy  Treatment Goals addressed: Coping  Interventions: CBT, DBT, Supportive and Reframing   Summary: Clinician led check-in regarding current stressors and situation. Clinician utilized active listening and empathetic response and validated patient emotions. Clinician facilitated processing group on pertinent issues.    Therapist Response: Savannah [name] is a 35 y.o. female who presents with mood symptoms. Patient arrived within time allowed and reports that she is feeling "groggy." Patient rates her mood at a 6 on a scale of 1-10 with 10 being great. Pt reports she continues to feel groggy from medication however it is improving and she is also noticing positive effects. Pt states she went to a concert alone and was proud of herself for managing anxiety. Pt  reports some struggles with family. Pt able to process. Pt engaged in discussion.              Session Time: 10:00 -11:00   Participation Level: Active   Behavioral Response: CasualAlertDepressed   Type of Therapy: Group therapy   Treatment Goals addressed: Coping   Interventions: CBT, DBT, Supportive and Reframing   Summary: Cln led discussion on the ways our phones can cause barriers to our mental health. Group discussed struggles they have with their phones and the role it can play in unhealthy thought patterns and behaviors. Group worked to problem solve ways to cut down on negative behaviors.      Therapist Response: Pt engaged in discussion and was able to process.      Session Time: 11:00- 12:00   Participation Level: Active   Behavioral Response: CasualAlertDepressed   Type of Therapy: Group Therapy, OT   Treatment Goals addressed: Coping   Interventions: Psychosocial skills training, Supportive   Summary: Occupational Therapy group   Therapist Response: Patient engaged in group. See OT note.           Session Time: 12:00 -1:00   Participation Level: Active   Behavioral Response: CasualAlertDepressed   Type of Therapy: Group therapy   Treatment Goals addressed: Coping   Interventions: CBT; Solution focused; Supportive; Reframing   Summary: 12:00 - 12:50: Cln introduced topic of boundaries. Cln discussed how boundaries inform our relationships and affect self-esteem and personal agency. Group discussed the three types of boundaries: rigid, porous, and healthy and when each type is most helpful/harmful.  12:50 -1:00 Clinician led check-out. Clinician assessed for immediate needs, medication compliance and efficacy, and safety concerns  Therapist Response: 12:50 - 1:00: Pt engaged in discussion and reports having mostly porous boundaries.  12:50 - 1:00: At check-out, patient rates her mood at a 7 on a scale of 1-10 with 10 being great. Pt reports  afternoon plans of going on a walk. Pt demonstrates some progress as evidenced by positive introspection. Patient denies SI/HI at the end of group.    Suicidal/Homicidal: Nowithout intent/plan  Plan: Pt will continue in PHP while working to increase emotional regulation and healthy coping ability.   Diagnosis: Bipolar 2 disorder (HCC) [F31.81]    1. Bipolar 2 disorder (HCC)       [name], LCSW 02/11/2021

## 2021-02-11 NOTE — Psych (Signed)
Virtual Visit via Video Note  I connected with [name] on 01/27/21 at  9:00 AM EDT by a video enabled telemedicine application and verified that I am speaking with the correct person using two identifiers.  Location: Patient: patient home Provider: clinical home office   I discussed the limitations of evaluation and management by telemedicine and the availability of in person appointments. The patient expressed understanding and agreed to proceed.  I discussed the assessment and treatment plan with the patient. The patient was provided an opportunity to ask questions and all were answered. The patient agreed with the plan and demonstrated an understanding of the instructions.   The patient was advised to call back or seek an in-person evaluation if the symptoms worsen or if the condition fails to improve as anticipated.  Pt was provided 240 minutes of non-face-to-face time during this encounter.   [name], LCSW    [facility] PHP THERAPIST PROGRESS NOTE  Savannah [name] [id number]  Session Time: 9:00 - 10:00  Participation Level: Active  Behavioral Response: CasualAlertDepressed  Type of Therapy: Group Therapy  Treatment Goals addressed: Coping  Interventions: CBT, DBT, Supportive and Reframing   Summary: Clinician led check-in regarding current stressors and situation. Clinician utilized active listening and empathetic response and validated patient emotions. Clinician facilitated processing group on pertinent issues.    Therapist Response: Glinda Natzke is a 35 y.o. female who presents with mood symptoms. Patient arrived within time allowed and reports that she is feeling "frustrated" Patient rates her mood at a 5 on a scale of 1-10 with 10 being great. Pt reports she had 2 dates cancel this week and she is struggling with catastrophic thinking. Pt has little insight into distorted thoughts and is resistant to engaging with challenging. Pt struggles with  victimizing thoughts. Pt struggles to acknowledge emotional reactivity.  Pt able to process. Pt engaged in discussion.              Session Time: 10:00 -11:00   Participation Level: Active   Behavioral Response: CasualAlertDepressed   Type of Therapy: Group therapy   Treatment Goals addressed: Coping   Interventions: CBT, DBT, Supportive and Reframing   Summary: Cln led discussion on protecting our energy. Cln brought in topics of boundaries and self-esteem. Group discussed what drains them and why they engage in those activities. Cln encouraged pt's to consider what will protect their energy when making decisions.       Therapist Response:  Pt engaged in discussion and is able to process.         Session Time: 11:00- 12:00   Participation Level: Active   Behavioral Response: CasualAlertDepressed   Type of Therapy: Group Therapy, OT   Treatment Goals addressed: Coping   Interventions: Psychosocial skills training, Supportive   Summary: Occupational Therapy group   Therapist Response: Patient engaged in group. See OT note.           Session Time: 12:00 -1:00   Participation Level: Active   Behavioral Response: CasualAlertDepressed   Type of Therapy: Group therapy   Treatment Goals addressed: Coping   Interventions: CBT; Solution focused; Supportive; Reframing   Summary: 12:00 - 12:50: Cln led discussion on ways to manage stressors and feelings over the weekend. Group members  brainstormed things to do over the weekend for multiple levels of energy, access, and moods. Cln reviewed crisis services should they be needed and provided pt's with the text crisis line, mobile crisis, national suicide hotline, [facility] 24/7 line, and  information on [facility].  12:50 -1:00 Clinician led check-out. Clinician assessed for immediate needs, medication compliance and efficacy, and safety concerns   Therapist Response: 12:50 - 1:00:  Pt engaged in discussion and is able  to identify 3 ideas of what to do over the weekend to keep their mind engaged.  12:50 - 1:00: At check-out, patient rates her mood at a 4 on a scale of 1-10 with 10 being great. Pt reports afternoon plans of "trying not to tirgger myself." Pt demonstrates limited progress as evidenced by resisting coaching and application of therapeutic strategies. Patient denies SI/HI at the end of group.    Suicidal/Homicidal: Nowithout intent/plan  Plan: Pt will continue in PHP while working to increase emotional regulation and healthy coping ability.   Diagnosis: Bipolar 2 disorder (HCC) [F31.81]    1. Bipolar 2 disorder (HCC)       [name], LCSW 02/11/2021

## 2021-02-11 NOTE — Psych (Signed)
Virtual Visit via Video Note  I connected with Savannah [name] on 01/31/21 at  9:00 AM EDT by a video enabled telemedicine application and verified that I am speaking with the correct person using two identifiers.  Location: Savannah [name]: Savannah [name] home Provider: clinical home office   I discussed the limitations of evaluation and management by telemedicine and the availability of in person appointments. The Savannah [name] expressed understanding and agreed to proceed.  I discussed the assessment and treatment plan with the Savannah [name]. The Savannah [name] was provided an opportunity to ask questions and all were answered. The Savannah [name] agreed with the plan and demonstrated an understanding of the instructions.   The Savannah [name] was advised to call back or seek an in-person evaluation if the symptoms worsen or if the condition fails to improve as anticipated.  Pt was provided 240 minutes of non-face-to-face time during this encounter.   [name], LCSW    [facility] PHP THERAPIST PROGRESS NOTE  Savannah [name] [id number]  Session Time: 9:00 - 10:00  Participation Level: Active  Behavioral Response: CasualAlertDepressed  Type of Therapy: Group Therapy  Treatment Goals addressed: Coping  Interventions: CBT, DBT, Supportive and Reframing   Summary: Clinician led check-in regarding current stressors and situation. Clinician utilized active listening and empathetic response and validated Savannah [name] emotions. Clinician facilitated processing group on pertinent issues.    Therapist Response: Savannah [name] is a 35 y.o. female who presents with mood symptoms. Savannah [name] arrived within time allowed and reports that she is feeling "bad" Savannah [name] rates her mood at a 4 on a scale of 1-10 with 10 being great. Pt reports she spent the weekend spiraling about an email from work. Pt uses catastrophic and personalizing distortions and resists reframing. Pt continues to struggle with emotional reactivity. Pt able to process.  Pt engaged in discussion.        Session Time: 10:00 -11:00   Participation Level: Active   Behavioral Response: CasualAlertDepressed   Type of Therapy: Group therapy   Treatment Goals addressed: Coping   Interventions: CBT, DBT, Supportive and Reframing   Summary: Cln led discussion on setting boundaries as a way to increase self-care. Group members discussed things that are stumbling blocks to them engaging in self-care and worked to determine what boundary could address that stumbling block. Group worked together to determine how to address the boundary. Cln brought in topics of boundaries, assertiveness, thought challenging, and self-care.      Therapist Response:  Pt engaged in discussion and identifies a boundary that can improve their self-care.         Session Time: 11:00- 12:00   Participation Level: Active   Behavioral Response: CasualAlertDepressed   Type of Therapy: Group Therapy   Treatment Goals addressed: Coping   Interventions: CBT, DBT, Supportive and Reframing   Summary: Cln introduced grounding techniques as a coping strategy. Cln utilized handout "Detaching from emotional pain" from [employer]. Group reviewed grounding strategies and how they can apply them to their every day life and in which situations.    Therapist Response: Pt engaged in discussion and is able to identify ways to utilize the techniques.          Session Time: 12:00 -1:00   Participation Level: Active   Behavioral Response: CasualAlertDepressed   Type of Therapy: Group therapy, OT   Treatment Goals addressed: Coping   Interventions: Psychosocial skills training, Supportive   Summary: 12:00 - 12:50: Occupational Therapy group 12:50 -1:00 Clinician led check-out. Clinician assessed for immediate needs, medication  compliance and efficacy, and safety concerns   Therapist Response: 12:50 - 1:00: Savannah [name] engaged in group. See OT note.   12:50 - 1:00: At check-out,  Savannah [name] rates her mood at a 6 on a scale of 1-10 with 10 being great. Pt reports afternoon plans of napping. Pt demonstrates some progress as evidenced by improved mood through session. Savannah [name] denies SI/HI at the end of group.    Suicidal/Homicidal: Nowithout intent/plan  Plan: Pt will continue in PHP while working to increase emotional regulation and healthy coping ability.   Diagnosis: Bipolar 2 disorder (HCC) [F31.81]    1. Bipolar 2 disorder (HCC)   2. MDD (major depressive disorder), recurrent episode, moderate (HCC)       [name], LCSW 02/11/2021

## 2021-02-11 NOTE — Psych (Signed)
Virtual Visit via Video Note  I connected with Savannah [name] on 02/02/21 at  9:00 AM EDT by a video enabled telemedicine application and verified that I am speaking with the correct person using two identifiers.  Location: Patient: patient home Provider: clinical home office   I discussed the limitations of evaluation and management by telemedicine and the availability of in person appointments. The patient expressed understanding and agreed to proceed.  I discussed the assessment and treatment plan with the patient. The patient was provided an opportunity to ask questions and all were answered. The patient agreed with the plan and demonstrated an understanding of the instructions.   The patient was advised to call back or seek an in-person evaluation if the symptoms worsen or if the condition fails to improve as anticipated.  Pt was provided 300 minutes of non-face-to-face time during this encounter.   Lorin Glass, LCSW    [facility] PHP THERAPIST PROGRESS NOTE  Savannah [name] [id number]  Session Time: 9:00 - 10:00  Participation Level: Active  Behavioral Response: CasualAlertDepressed  Type of Therapy: Group Therapy  Treatment Goals addressed: Coping  Interventions: CBT, DBT, Supportive and Reframing   Summary: Clinician led check-in regarding current stressors and situation. Clinician utilized active listening and empathetic response and validated patient emotions. Clinician facilitated processing group on pertinent issues.    Therapist Response: Savannah [name] is a 35 y.o. female who presents with mood symptoms. Patient arrived within time allowed and reports that she is feeling "okay" Patient rates her mood at a 4 on a scale of 1-10 with 10 being great. Pt reports she had an intake for her new outpatient providers yesterday and the therapist suggested pt may have BPD. Pt reports fixation on this idea and feels like "someone finally understood me." Pt reports she  is still processing. Pt states feeling "scared" about this possible new diagnosis and having to use distractions to manage. Pt able to process. Pt engaged in discussion.         Session Time: 10:00 -11:00   Participation Level: Active   Behavioral Response: CasualAlertDepressed   Type of Therapy: Group therapy   Treatment Goals addressed: Coping   Interventions: CBT, DBT, Supportive and Reframing   Summary: Cln led discussion on communicating our struggles with our support team. Cln introduced the concept of giving disclosures to the people close to [place] regarding the way we act in specific situations or the way we process certain stimuli. Group members discussed ways to provide this "road map to our brain" and in what situations this may be helpful to them      Therapist Response: Pt engaged in discussion and is able to determine situations in which providing a disclosure can be helpful and practiced doing so.      Session Time: 11:00- 12:00   Participation Level: Active   Behavioral Response: CasualAlertDepressed   Type of Therapy: Group Therapy, OT   Treatment Goals addressed: Coping   Interventions: Psychosocial skills training, Supportive   Summary: Occupational Therapy group   Therapist Response: Patient engaged in group. See OT note.           Session Time: 12:00 -1:00   Participation Level: Active   Behavioral Response: CasualAlertDepressed   Type of Therapy: Group therapy   Treatment Goals addressed: Coping   Interventions: CBT; Solution focused; Supportive; Reframing   Summary: 12:00 - 12:50: Cln introduced topic of vulnerability. Group viewed TED talk "the power of vulnerability" to aid discussion. Group members shared  the relationship they have with vulnerability and how it impacts their relationships.  12:50 -1:00 Clinician led check-out. Clinician assessed for immediate needs, medication compliance and efficacy, and safety concerns   Therapist  Response: 12:50 - 1:00:  Pt engaged in discussion and reports vulnerability is a struggle. Pt is tangential during session and struggles to stay on topic due to emotional reactivity. 12:50 - 1:00: At check-out, patient rates her mood at a 5 on a scale of 1-10 with 10 being great. Pt reports afternoon plans of resting. Pt demonstrates some progress as evidenced by some attempts to respond to group coaching. Patient denies SI/HI at the end of group.   Family Session 1:00 - 2:00: Cln met with pt and pt's mom, Savannah [name], in a family session regarding pt's course of treatment and next steps. Cln provided context of treatment, diagnosis, and next steps for pt's mom. Cln answered all questions and facilitated pt and pt's mom discussing how they can support one another.    Suicidal/Homicidal: Nowithout intent/plan  Plan: Pt will continue in PHP while working to increase emotional regulation and healthy coping ability.   Diagnosis: Bipolar 2 disorder (HCC) [F31.81]    1. Bipolar 2 disorder ([place])       Lorin Glass, LCSW 02/11/2021

## 2021-02-11 NOTE — Psych (Signed)
Virtual Visit via Video Note  I connected with Savannah [name] on 01/25/21 at  9:00 AM EDT by a video enabled telemedicine application and verified that I am speaking with the correct person using two identifiers.  Location: Patient: patient home Provider: clinical home office   I discussed the limitations of evaluation and management by telemedicine and the availability of in person appointments. The patient expressed understanding and agreed to proceed.  I discussed the assessment and treatment plan with the patient. The patient was provided an opportunity to ask questions and all were answered. The patient agreed with the plan and demonstrated an understanding of the instructions.   The patient was advised to call back or seek an in-person evaluation if the symptoms worsen or if the condition fails to improve as anticipated.  Pt was provided 240 minutes of non-face-to-face time during this encounter.   [name], LCSW    [facility] PHP THERAPIST PROGRESS NOTE  Savannah [name] [id number]  Session Time: 9:00 - 10:00  Participation Level: Active  Behavioral Response: CasualAlertDepressed  Type of Therapy: Group Therapy  Treatment Goals addressed: Coping  Interventions: CBT, DBT, Supportive and Reframing   Summary: Clinician led check-in regarding current stressors and situation. Clinician utilized active listening and empathetic response and validated patient emotions. Clinician facilitated processing group on pertinent issues.    Therapist Response: Savannah [name] is a 35 y.o. female who presents with mood symptoms. Patient arrived within time allowed and reports that she is feeling "kind of pissed." Patient rates her mood at a 5 on a scale of 1-10 with 10 being great. Pt reports she is upset with the healthcare system after having to wait in the ED for 5 hours. Pt states she went to the ED for her abdomen pain and after having tests run had a long wait and ended up  leaving before speaking to anyone re: the results. Pt states she found out on her patient portal later that she has gallstones. Pt is trying to get an appointment with her PCP today to determine what she has to do. Pt struggles to access logic in her anger and exhibits high emotional reactivity such as reporting she considered slitting her wrists in the ED so she could be seen quicker. Pt states she was able to talk herself out of acting on that thought. Pt able to process. Pt engaged in discussion.              Session Time: 10:00 -11:00   Participation Level: Active   Behavioral Response: CasualAlertDepressed   Type of Therapy: Group therapy   Treatment Goals addressed: Coping   Interventions: CBT, DBT, Supportive and Reframing   Summary: Cln led discussion on "spiraling" or when our thoughts compound on one another to descend our feelings into worse and worse situations. Group members discussed the ways in which spiraling is difficult for them and when they are especially vulnerable. Cln offered DBT distraction skills as a way to halt the spiral once you recognize it.   Therapist Response:  Pt engaged in discussion and reports spiraling is an issue for her. Pt is able to increase awareness of spiraling throughout the discussion.        Session Time: 11:00 - 12:00   Participation Level: Active   Behavioral Response: CasualAlertDepressed   Type of Therapy: Group Therapy   Treatment Goals addressed: Coping   Interventions: Supportive, Reframing   Summary: Spiritual Care group   Therapist Response: Pt engaged in session. See  chaplain note           Session Time: 12:00 -1:00   Participation Level: Active   Behavioral Response: CasualAlertDepressed   Type of Therapy: Group therapy   Treatment Goals addressed: Coping   Interventions: Psychosocial skills training, Supportive   Summary: 12:00 - 12:50: Occupational Therapy group 12:50 -1:00 Clinician led check-out.  Clinician assessed for immediate needs, medication compliance and efficacy, and safety concerns   Therapist Response: 12:00 - 12:50: Patient engaged in group. See OT note.  12:50 - 1:00: At check-out, patient rates her mood at a 6 on a scale of 1-10 with 10 being great. Pt reports afternoon plans of going to the doctor re: her gallstones. Pt demonstrates limited progress as evidenced by resisting managing mood when angry. Patient denies SI/HI at the end of group.    Suicidal/Homicidal: Nowithout intent/plan  Plan: Pt will continue in PHP while working to increase emotional regulation and healthy coping ability.   Diagnosis: Bipolar 2 disorder (HCC) [F31.81]    1. Bipolar 2 disorder (HCC)       [name], LCSW 02/11/2021

## 2021-02-11 NOTE — Psych (Signed)
Virtual Visit via Video Note  I connected with Savannah [name] on 02/06/21 at  9:00 AM EDT by a video enabled telemedicine application and verified that I am speaking with the correct person using two identifiers.  Location: Patient: patient home Provider: clinical home office   I discussed the limitations of evaluation and management by telemedicine and the availability of in person appointments. The patient expressed understanding and agreed to proceed.  I discussed the assessment and treatment plan with the patient. The patient was provided an opportunity to ask questions and all were answered. The patient agreed with the plan and demonstrated an understanding of the instructions.   The patient was advised to call back or seek an in-person evaluation if the symptoms worsen or if the condition fails to improve as anticipated.  Pt was provided 240 minutes of non-face-to-face time during this encounter.   [name], LCSW    [facility] PHP THERAPIST PROGRESS NOTE  Toby Ayad [id number]  Session Time: 9:00 - 10:00  Participation Level: Active  Behavioral Response: CasualAlertDepressed  Type of Therapy: Group Therapy  Treatment Goals addressed: Coping  Interventions: CBT, DBT, Supportive and Reframing   Summary: Clinician led check-in regarding current stressors and situation. Clinician utilized active listening and empathetic response and validated patient emotions. Clinician facilitated processing group on pertinent issues.    Therapist Response: Liddy Deam is a 35 y.o. female who presents with mood symptoms. Patient arrived within time allowed and reports that she is feeling "everything sucks." Patient rates her mood at a 2 on a scale of 1-10 with 10 being great. Pt reports she is struggling financially and feeing lost as to what to do. Pt reports she has been spiraling all weekend and having SI. Pt vocalizes distorted thinking and is resistant to cln's attempt  to challenge. Pt states "I won't listen to anything you are saying right now." Pt is crying throughout session. Pt reports she has not showered in a week. Pt able to process. Pt engaged in discussion.         Session Time: 10:00 -11:00   Participation Level: Active   Behavioral Response: CasualAlertDepressed   Type of Therapy: Group therapy   Treatment Goals addressed: Coping   Interventions: CBT, DBT, Supportive and Reframing   Summary: Cln led processing group for pt's current struggles. Group members shared stressors and provided support and feedback. Cln brought in topics of boundaries, healthy relationships, and unhealthy thought processes to inform discussion.      Therapist Response:  Pt able to process and provide support to group.       Session Time: 11:00- 12:00   Participation Level: Active   Behavioral Response: CasualAlertDepressed   Type of Therapy: Group Therapy, OT   Treatment Goals addressed: Coping   Interventions: Psychosocial skills training, Supportive   Summary: Occupational Therapy group   Therapist Response: Patient engaged in group. See OT note.           Session Time: 12:00 -1:00   Participation Level: Active   Behavioral Response: CasualAlertDepressed   Type of Therapy: Group therapy   Treatment Goals addressed: Coping   Interventions: CBT; Solution focused; Supportive; Reframing   Summary: 12:00 - 12:50: Cln continued topic of DBT distress tolerance skills. Cln introduced TIPP skills to use during cases of extreme emotion. Group practiced deep breathing and progressive muscle relaxation together. Group discussed which situations they can apply TIPP skills.  12:50 -1:00 Clinician led check-out. Clinician assessed for immediate needs, medication compliance  and efficacy, and safety concerns   Therapist Response: 12:50 - 1:00:  Pt engaged in discussion and is able to identify how to apply TIPP skills into their life. 12:50 - 1:00: At  check-out, patient rates her mood at a 4 on a scale of 1-10 with 10 being great. Pt reports afternoon plans of using TIPP skills. Pt demonstrates some progress as evidenced by regulating emotion with assistance. Patient denies SI/HI at the end of group.    Suicidal/Homicidal: Nowithout intent/plan  Plan: Pt will continue in PHP while working to increase emotional regulation and healthy coping ability.   Diagnosis: Bipolar 2 disorder (HCC) [F31.81]    1. Bipolar 2 disorder (HCC)   2. MDD (major depressive disorder), recurrent episode, moderate (HCC)       [name], LCSW 02/11/2021

## 2021-02-11 NOTE — Psych (Signed)
**Note Savannah-Identified via Obfuscation** Virtual Visit via Video Note  I connected with Savannah [name] on 02/03/21 at  9:00 AM EDT by a video enabled telemedicine application and verified that I am speaking with the correct person using two identifiers.  Location: Patient: patient home Provider: clinical home office   I discussed the limitations of evaluation and management by telemedicine and the availability of in person appointments. The patient expressed understanding and agreed to proceed.  I discussed the assessment and treatment plan with the patient. The patient was provided an opportunity to ask questions and all were answered. The patient agreed with the plan and demonstrated an understanding of the instructions.   The patient was advised to call back or seek an in-person evaluation if the symptoms worsen or if the condition fails to improve as anticipated.  Pt was provided 240 minutes of non-face-to-face time during this encounter.   [name], LCSW    [facility] PHP THERAPIST PROGRESS NOTE  Savannah [name] [id number]  Session Time: 9:00 - 10:00  Participation Level: Active  Behavioral Response: CasualAlertDepressed  Type of Therapy: Group Therapy  Treatment Goals addressed: Coping  Interventions: CBT, DBT, Supportive and Reframing   Summary: Clinician led check-in regarding current stressors and situation. Clinician utilized active listening and empathetic response and validated patient emotions. Clinician facilitated processing group on pertinent issues.    Therapist Response: Savannah [name] is a 35 y.o. female who presents with mood symptoms. Patient arrived within time allowed and reports that she is feeling "annoyed." Patient rates her mood at a 5 on a scale of 1-10 with 10 being great. Pt reports she is stressed about finances and does not want to ask for help because that is difficult for her. Pt continues to struggle with applying techniques to manage symptoms. Pt able to process. Pt  engaged in discussion.         Session Time: 10:00 -11:00   Participation Level: Active   Behavioral Response: CasualAlertDepressed   Type of Therapy: Group therapy   Treatment Goals addressed: Coping   Interventions: CBT, DBT, Supportive and Reframing   Summary: Cln led activity on finding what makes you happy. Cln tasked group members to consider times in which they have been happy in the past, moments in which they have felt less distressed recently, and things that give them any rumblings of joy. Cln worked with pt's to process barriers and to consider one thing for now, not the bigger picture.      Therapist Response: Pt engaged in discussion and struggled to think of times she has been happy. Pt able to process and come up with one thing thing to do to make herself happy.      Session Time: 11:00- 12:00   Participation Level: Active   Behavioral Response: CasualAlertDepressed   Type of Therapy: Group Therapy, OT   Treatment Goals addressed: Coping   Interventions: Psychosocial skills training, Supportive   Summary: Occupational Therapy group   Therapist Response: Patient engaged in group. See OT note.           Session Time: 12:00 -1:00   Participation Level: Active   Behavioral Response: CasualAlertDepressed   Type of Therapy: Group therapy   Treatment Goals addressed: Coping   Interventions: CBT; Solution focused; Supportive; Reframing   Summary: 12:00 - 12:50: Cln introduced DBT dstress tolerance skill: Self-Soothe skills. Group discussed ways they can utilize the five senses to soothe themselves when struggling.  12:50 -1:00 Clinician led check-out. Clinician assessed for immediate needs, medication  compliance and efficacy, and safety concerns   Therapist Response: 12:50 - 1:00: Pt engaged in discussion and is able to brainstorm ways to apply self soothing skills.  12:50 - 1:00: At check-out, patient rates her mood at a 6 on a scale of 1-10 with 10  being great. Pt reports afternoon plans of cleaning her fridge and doing yoga. Pt demonstrates some progress as evidenced by finding a new distraction. Patient denies SI/HI at the end of group.    Suicidal/Homicidal: Nowithout intent/plan  Plan: Pt will continue in PHP while working to increase emotional regulation and healthy coping ability.   Diagnosis: Bipolar 2 disorder (HCC) [F31.81]    1. Bipolar 2 disorder (HCC)   2. MDD (major depressive disorder), recurrent episode, moderate (HCC)       [name], LCSW 02/11/2021

## 2021-02-11 NOTE — Psych (Signed)
Virtual Visit via Video Note  I connected with Savannah [name] on 01/20/21 at  9:00 AM EDT by a video enabled telemedicine application and verified that I am speaking with the correct person using two identifiers.  Location: Patient: patient home Provider: clinical home office   I discussed the limitations of evaluation and management by telemedicine and the availability of in person appointments. The patient expressed understanding and agreed to proceed.  I discussed the assessment and treatment plan with the patient. The patient was provided an opportunity to ask questions and all were answered. The patient agreed with the plan and demonstrated an understanding of the instructions.   The patient was advised to call back or seek an in-person evaluation if the symptoms worsen or if the condition fails to improve as anticipated.  Pt was provided 240 minutes of non-face-to-face time during this encounter.   [name], LCSW    [facility] PHP THERAPIST PROGRESS NOTE  Savannah [name] [id number]  Session Time: 9:00 - 10:00  Participation Level: Active  Behavioral Response: CasualAlertDepressed  Type of Therapy: Group Therapy  Treatment Goals addressed: Coping  Interventions: CBT, DBT, Supportive and Reframing   Summary: Clinician led check-in regarding current stressors and situation. Clinician utilized active listening and empathetic response and validated patient emotions. Clinician facilitated processing group on pertinent issues.    Therapist Response: Savannah [name] is a 35 y.o. female who presents with mood symptoms. Patient arrived within time allowed and reports that she is feeling "self-reflective." Patient rates her mood at a 6 on a scale of 1-10 with 10 being great. Pt reports she slept most of yesterday, but was able to get out of the house for a little bit and walk. Pt reports feeling physically better. Pt states she has been up since 4 am and struggling with  rumination. Pt able to process. Pt engaged in discussion.              Session Time: 10:00 -11:00   Participation Level: Active   Behavioral Response: CasualAlertDepressed   Type of Therapy: Group therapy   Treatment Goals addressed: Coping   Interventions: CBT, DBT, Supportive and Reframing   Summary: Cln led discussion on healthy relationships. Cln discussed the way in which self-esteem can affect what we accept in relationships. Cln emphasized focusing on actions rather than words with those we are in relationship with. Group members shared ways in which their self-esteem has affected relationship dynamics.      Therapist Response: Pt engaged in discussion and is able to process and gain insight.         Session Time: 11:00- 12:00   Participation Level: Active   Behavioral Response: CasualAlertDepressed   Type of Therapy: Group Therapy, OT   Treatment Goals addressed: Coping   Interventions: Psychosocial skills training, Supportive   Summary: Occupational Therapy group   Therapist Response: Patient engaged in group. See OT note.           Session Time: 12:00 -1:00   Participation Level: Active   Behavioral Response: CasualAlertDepressed   Type of Therapy: Group therapy   Treatment Goals addressed: Coping   Interventions: CBT; Solution focused; Supportive; Reframing   Summary: 12:00 - 12:50: Cln led discussion on valuing ourselves in the same way, or more than we value others. Group viewed TED talk "The Person You Really Need to Marry" to facilitate discussion. Group discussed what it would be like to view themselves as they do a romantic partner and treat themselves with  as much care.  12:50 -1:00 Clinician led check-out. Clinician assessed for immediate needs, medication compliance and efficacy, and safety concerns   Therapist Response: 12:50 - 1:00: Pt engaged in discussion and is able to share struggles with valuing themselves.  12:50 - 1:00: At  check-out, patient rates her mood at a 8 on a scale of 1-10 with 10 being great. Pt reports afternoon plans of playing a video game. Pt demonstrates some progress as evidenced by improved mood. Patient denies SI/HI at the end of group.    Suicidal/Homicidal: Nowithout intent/plan  Plan: Pt will continue in PHP while working to increase emotional regulation and healthy coping ability.   Diagnosis: Bipolar 2 disorder (HCC) [F31.81]    1. Bipolar 2 disorder (HCC)       [name], LCSW 02/11/2021

## 2021-02-11 NOTE — Progress Notes (Signed)
Virtual Visit via Video Note  I connected with Savannah [name] on 01/17/21 at  2:00 PM EDT by a video enabled telemedicine application and verified that I am speaking with the correct person using two identifiers.  Location: Patient: patient home Provider: clinical home office   I discussed the limitations of evaluation and management by telemedicine and the availability of in person appointments. The patient expressed understanding and agreed to proceed.   I discussed the assessment and treatment plan with the patient. The patient was provided an opportunity to ask questions and all were answered. The patient agreed with the plan and demonstrated an understanding of the instructions.   The patient was advised to call back or seek an in-person evaluation if the symptoms worsen or if the condition fails to improve as anticipated.  I provided 60 minutes of non-face-to-face time during this encounter.   [name], LCSW    Comprehensive Clinical Assessment (CCA) Note  02/11/2021 Savannah [name] [id number]  Chief Complaint:  Chief Complaint  Patient presents with   Depression     CCA Biopsychosocial Intake/Chief Complaint:  Pt presents as referral for PHP from [facility] due to increased mood issues tht are impacting her daily life. Pt reports historical diagnoses of depression and bipolar 2. Pt presents as emotionally reactive and possibly hypomanic. She is tangential in speech, jumps topics, and minimally responds to redirection. Pt reports her parents are concerned for her and wanted her to go to the ED last weekend due to pt's presentation. Pt reports volatile relationships and poor support. Pt reports MH symptoms are interfering with her work.  Pt reports passive SI, worthlessness, hopelessness. Pt denies HI and AVH.  Current Symptoms/Problems: Pt reports poor sleep, decreased appetite, inconsistent energy, scattered and poor focus, depressed mood, poor emotional regulation,  anxiety and worry. Pt reports passive SI.   Patient Reported Schizophrenia/Schizoaffective Diagnosis in Past: No   Strengths: Pt is self-motivated for treatment  Preferences: intensive treatment  Abilities: No data recorded  Type of Services Patient Feels are Needed: PHP   Initial Clinical Notes/Concerns: No data recorded  Mental Health Symptoms Depression:   Change in energy/activity; Difficulty Concentrating; Hopelessness; Worthlessness; Increase/decrease in appetite; Sleep (too much or little)   Duration of Depressive symptoms:  Greater than two weeks   Mania:   Racing thoughts; Irritability   Anxiety:    Difficulty concentrating; Fatigue; Irritability; Sleep   Psychosis:   None   Duration of Psychotic symptoms: No data recorded  Trauma:   None   Obsessions:   None   Compulsions:   None   Inattention:   None   Hyperactivity/Impulsivity:   None   Oppositional/Defiant Behaviors:   None   Emotional Irregularity:   Intense/unstable relationships; Mood lability; Chronic feelings of emptiness   Other Mood/Personality Symptoms:  No data recorded   Mental Status Exam Appearance and self-care  Stature:   Average   Weight:   Average weight   Clothing:   Casual   Grooming:   Normal   Cosmetic use:   None   Posture/gait:   Normal   Motor activity:   Not Remarkable   Sensorium  Attention:   Normal; Distractible   Concentration:   Scattered   Orientation:   X5   Recall/memory:   Normal   Affect and Mood  Affect:   Depressed; Labile   Mood:   Depressed   Relating  Eye contact:   Normal   Facial expression:   Responsive   Attitude  toward examiner:   Cooperative   Thought and Language  Speech flow:  Loud; Normal   Thought content:   Appropriate to Mood and Circumstances   Preoccupation:   None   Hallucinations:   None   Organization:  No data recorded  [employer] of Knowledge:   Average    Intelligence:   Average   Abstraction:   Functional   Judgement:   Fair   Reality Testing:   Adequate   Insight:   Fair   Decision Making:   Impulsive   Social Functioning  Social Maturity:   Self-centered   Social Judgement:   Victimized   Stress  Stressors:   Family conflict; Transitions; Work   Coping Ability:   Deficient supports   Skill Deficits:   Self-care; Interpersonal   Supports:   Family; Friends/Service system     Religion: Religion/Spirituality Are You A Religious Person?: No  Leisure/Recreation: Leisure / Recreation Do You Have Hobbies?: No  Exercise/Diet: Exercise/Diet Do You Exercise?: No Have You Gained or Lost A Significant Amount of Weight in the Past Six Months?: No Do You Follow a Special Diet?: No Do You Have Any Trouble Sleeping?: Yes Explanation of Sleeping Difficulties: Pt reports sleeping avg 3 hours a night   CCA Employment/Education Employment/Work Situation: Employment / Work Situation Employment Situation: Leave of absence Patient's Job has Been Impacted by Current Illness: Yes Describe how Patient's Job has Been Impacted: Pt reports mood instability interferes with ability to do job Has Patient ever Been in the [employer]?: No  Education: Education Is Patient Currently Attending School?: No Did Garment/textile technologist From [employer]?: Yes Did Theme park manager?: No Did Designer, television/film set?: No Did You Have An Individualized Education Program (IIEP): No Did You Have Any Difficulty At School?: Yes Patient's Education Has Been Impacted by Current Illness: No   CCA Family/Childhood History Family and Relationship History: Family history Marital status: Single Are you sexually active?: Yes Does patient have children?: No  Childhood History:  Childhood History By whom was/is the patient raised?: Mother/father and step-parent Description of patient's relationship with caregiver when they were a child: Pt  states "fine" Patient's description of current relationship with people who raised him/her: Pt's father died in Dec 21, 2014, pt reports current issues with mom/step-dad due to their reaction to her MH symptoms Does patient have siblings?: Yes Number of Siblings: 3 Description of patient's current relationship with siblings: Pt reports 1 sister and 1 step-sister and step-brother. Pt states she is good with all of them and closest to the sister Did patient suffer any verbal/emotional/physical/sexual abuse as a child?: No Did patient suffer from severe childhood neglect?: No Has patient ever been sexually abused/assaulted/raped as an adolescent or adult?: No Was the patient ever a victim of a crime or a disaster?: No Witnessed domestic violence?: No Has patient been affected by domestic violence as an adult?: No  Child/Adolescent Assessment:     CCA Substance Use Alcohol/Drug Use: Alcohol / Drug Use Pain Medications: see MAR Prescriptions: see MAR Over the Counter: see MAR History of alcohol / drug use?: No history of alcohol / drug abuse (Pt reports some alcohol and marijuana use and declines diagnostic criteria)      ASAM's:  Six Dimensions of Multidimensional Assessment  Dimension 1:  Acute Intoxication and/or Withdrawal Potential:      Dimension 2:  Biomedical Conditions and Complications:      Dimension 3:  Emotional, Behavioral, or Cognitive Conditions and Complications:  Dimension 4:  Readiness to Change:     Dimension 5:  Relapse, Continued use, or Continued Problem Potential:     Dimension 6:  Recovery/Living Environment:     ASAM Severity Score:    ASAM Recommended Level of Treatment:     Substance use Disorder (SUD)    Recommendations for Services/Supports/Treatments: Recommendations for Services/Supports/Treatments Recommendations For Services/Supports/Treatments: Partial Hospitalization  DSM5 Diagnoses: There are no problems to display for this  patient.   Patient Centered Plan: Patient is on the following Treatment Plan(s):  Depression   Referrals to Alternative Service(s): Referred to Alternative Service(s):   Place:   Date:   Time:    Referred to Alternative Service(s):   Place:   Date:   Time:    Referred to Alternative Service(s):   Place:   Date:   Time:    Referred to Alternative Service(s):   Place:   Date:   Time:     [name], LCSW

## 2021-02-11 NOTE — Psych (Signed)
Virtual Visit via Video Note  I connected with Savannah [name] on 01/24/21 at  9:00 AM EDT by a video enabled telemedicine application and verified that I am speaking with the correct person using two identifiers.  Location: Patient: patient home Provider: clinical home office   I discussed the limitations of evaluation and management by telemedicine and the availability of in person appointments. The patient expressed understanding and agreed to proceed.  I discussed the assessment and treatment plan with the patient. The patient was provided an opportunity to ask questions and all were answered. The patient agreed with the plan and demonstrated an understanding of the instructions.   The patient was advised to call back or seek an in-person evaluation if the symptoms worsen or if the condition fails to improve as anticipated.  Pt was provided 240 minutes of non-face-to-face time during this encounter.   [name], LCSW    [facility] PHP THERAPIST PROGRESS NOTE  Savannah [name] [id number]  Session Time: 9:00 - 10:00  Participation Level: Active  Behavioral Response: CasualAlertDepressed  Type of Therapy: Group Therapy  Treatment Goals addressed: Coping  Interventions: CBT, DBT, Supportive and Reframing   Summary: Clinician led check-in regarding current stressors and situation. Clinician utilized active listening and empathetic response and validated patient emotions. Clinician facilitated processing group on pertinent issues.    Therapist Response: Savannah [name] is a 35 y.o. female who presents with mood symptoms. Patient arrived within time allowed and reports that she is feeling "groggy." Patient rates her mood at a 6 on a scale of 1-10 with 10 being great. Pt reports got out of the house yesterday which was good for her mood. Pt states talking to her sister and that alleviated some anxiety. Pt reports struggling with shame/guilt. Pt exhibits emotional reactivity  in group. Pt able to process. Pt engaged in discussion.              Session Time: 10:00 -11:00   Participation Level: Active   Behavioral Response: CasualAlertDepressed   Type of Therapy: Group therapy   Treatment Goals addressed: Coping   Interventions: CBT, DBT, Supportive and Reframing   Summary: Cln led discussion on the cycle of abuse and the way abuse affects [place]. Group membes shared struggles they have experienced with abusive or unhealthy dynamics in relationships and how it impacted them. Group member provided support to one another. Cln brought in the cycle of abuse, support, and thought challenging to inform discussion.     Therapist Response: Pt engaged in discussion and is able to process.           Session Time: 11:00- 12:00   Participation Level: Active   Behavioral Response: CasualAlertDepressed   Type of Therapy: Group Therapy, OT   Treatment Goals addressed: Coping   Interventions: Psychosocial skills training, Supportive   Summary: Occupational Therapy group   Therapist Response: Patient engaged in group. See OT note.           Session Time: 12:00 -1:00   Participation Level: Minimally   Behavioral Response: CasualAlertDepressed   Type of Therapy: Group therapy   Treatment Goals addressed: Coping   Interventions: CBT; Solution focused; Supportive; Reframing   Summary: 12:00 - 12:50: Cln continued topic of boundaries. Cln discussed the different ways boundaries present: physical, emotional, intellectual, sexual, material, and time. Group talked about the ways in which each type presents for them and is a struggle. 12:50 -1:00 Clinician led check-out. Clinician assessed for immediate needs, medication compliance and efficacy,  and safety concerns   Therapist Response: 12:50 - 1:00: Pt minimally engaged in discussion and exhibited abrupt changes of topic with no awareness.  12:50 - 1:00: At check-out, patient rates her mood at a 7 on a  scale of 1-10 with 10 being great. Pt reports afternoon plans of going to the doctor re: a pain in her abdomen. Pt demonstrates some progress as evidenced by thought challenging Patient denies SI/HI at the end of group.    Suicidal/Homicidal: Nowithout intent/plan  Plan: Pt will continue in PHP while working to increase emotional regulation and healthy coping ability.   Diagnosis: Bipolar 2 disorder (HCC) [F31.81]    1. Bipolar 2 disorder (HCC)   2. MDD (major depressive disorder), recurrent episode, moderate (HCC)       [name], LCSW 02/11/2021

## 2021-02-13 ENCOUNTER — Encounter (HOSPITAL_COMMUNITY): Payer: Self-pay

## 2021-02-13 ENCOUNTER — Other Ambulatory Visit: Payer: Self-pay

## 2021-02-13 ENCOUNTER — Other Ambulatory Visit (HOSPITAL_COMMUNITY): Payer: 59 | Admitting: Psychiatry

## 2021-02-13 DIAGNOSIS — F3181 Bipolar II disorder: Secondary | ICD-10-CM

## 2021-02-13 DIAGNOSIS — F329 Major depressive disorder, single episode, unspecified: Secondary | ICD-10-CM | POA: Diagnosis not present

## 2021-02-13 NOTE — Progress Notes (Signed)
Virtual Visit via Video Note  I connected with [name] on 02/13/21 at  9:00 AM EDT by a video enabled telemedicine application and verified that I am speaking with the correct person using two identifiers.  At orientation to the IOP program, Case Manager discussed the limitations of evaluation and management by telemedicine and the availability of in person appointments. The patient expressed understanding and agreed to proceed with virtual visits throughout the duration of the program.   Location:  Patient: Patient Home Provider: Home Office   History of Present Illness: Bipolar 2 DO  Observations/Objective: Check In: Case Manager checked in with all participants to review discharge dates, insurance authorizations, work-related documents and needs from the treatment team regarding medications. Client stated needs and engaged in discussion.   Initial Therapeutic Activity: Counselor facilitated a check-in with group members to assess mood and current functioning. Client shared details of their mental health management since our last session, including challenges and successes. Counselor engaged group in discussion, covering the following topics: mobile crisis, national helplines, navigating relationships, body sensations, navigating life transitions, paranoia, nightmares and flashbacks, and combating exhaustion. Client presents with moderate depression and moderate anxiety. Client denied any current SI/HI/psychosis.  Second Therapeutic Activity: Counselor provided psychoeducation using research from [employer] about how the body senses and experiences emotions. Counselor prompted group members to shared their firsthand body sensations and their observations related to the research. Counselor prompted group members to share current coping strategies-what's working and where there are issues/concerns. Group members shared feedback on topics and additional ideas with each other.   Check Out:  Counselor prompted group members to share what self-care practice or productivity activity they will engage in today. Group members shared their plans with the group. Client endorsed safety plan to be followed to prevent safety issues.   Assessment and Plan: Clinician recommends that Client remain in IOP treatment to better manage mental health symptoms, stabilization and to address treatment plan goals. Clinician recommends adherence to crisis/safety plan, taking medications as prescribed, and following up with medical professionals if any issues arise.    Follow Up Instructions: Clinician will send Webex link for next session. The Client was advised to call back or seek an in-person evaluation if the symptoms worsen or if the condition fails to improve as anticipated.     I provided 180 minutes of non-face-to-face time during this encounter.     Hilbert Odor, LCSW

## 2021-02-14 ENCOUNTER — Encounter (HOSPITAL_COMMUNITY): Payer: Self-pay

## 2021-02-14 ENCOUNTER — Other Ambulatory Visit (HOSPITAL_COMMUNITY): Payer: 59 | Admitting: Psychiatry

## 2021-02-14 ENCOUNTER — Ambulatory Visit (HOSPITAL_COMMUNITY): Payer: 59

## 2021-02-14 ENCOUNTER — Other Ambulatory Visit: Payer: Self-pay

## 2021-02-14 DIAGNOSIS — F3181 Bipolar II disorder: Secondary | ICD-10-CM

## 2021-02-14 DIAGNOSIS — F329 Major depressive disorder, single episode, unspecified: Secondary | ICD-10-CM | POA: Diagnosis not present

## 2021-02-14 MED ORDER — DULOXETINE HCL 20 MG PO CPEP: 20.0000 mg | ORAL_CAPSULE | Freq: Every day | ORAL | 0 refills | Status: DC

## 2021-02-14 MED ORDER — QUETIAPINE FUMARATE 100 MG PO TABS: 100.0000 mg | ORAL_TABLET | Freq: Every day | ORAL | 0 refills | Status: AC

## 2021-02-14 MED ORDER — PRAZOSIN HCL 1 MG PO CAPS: 1.0000 mg | ORAL_CAPSULE | Freq: Every day | ORAL | 0 refills | Status: DC

## 2021-02-14 NOTE — Progress Notes (Signed)
Virtual Visit via Video Note  I connected with Savannah [name] on 02/14/21 at  9:00 AM EDT by a video enabled telemedicine application and verified that I am speaking with the correct person using two identifiers.    At orientation to the IOP program, Case Manager discussed the limitations of evaluation and management by telemedicine and the availability of in person appointments. The patient expressed understanding and agreed to proceed with virtual visits throughout the duration of the program.   Location:  Patient: Patient Home Provider: Home Office   History of Present Illness: Bipolar 2 DO  Observations/Objective: Check In: Case Manager checked in with all participants to review discharge dates, insurance authorizations, work-related documents and needs from the treatment team regarding medications. Client stated needs and engaged in discussion.   Initial Therapeutic Activity: Counselor facilitated a check-in with group members to assess mood and current functioning. Client shared details of their mental health management since our last session, including challenges and successes. Counselor engaged group in discussion, covering the following topics: substance abuse impacting mental health, work related stress, vocations that support mental health, financial stressors, setting boundaries, navigating stages of life, and updates on coping skills used since last session. Client presents with moderate depression and moderate anxiety. Client denied any current SI/HI/psychosis.  Second Therapeutic Activity: Counselor presented information on Cognitive Distortions. Client shared a video outlining 15 styles of cognitive distortions. Group members shared which ones they do most in their thought processes. Counselor shared 5 strategies for combating distorted thinking. Counselor shared links for resources for Group Members to review for homework.  Check Out: Counselor closed program by allowing  time to celebrate a graduating group member. Counselor shared reflections on progress and allow space for group members to share well wishes and encouragements with the graduating client. Counselor prompted graduating client to share takeaways, reflect on progress and final thoughts for the group. Counselor prompted group members to share what self-care practice or productivity activity they will engage in today. Group members shared their plans with the group. Client endorsed safety plan to be followed to prevent safety issues.   Assessment and Plan: Clinician recommends that Client remain in IOP treatment to better manage mental health symptoms, stabilization and to address treatment plan goals. Clinician recommends adherence to crisis/safety plan, taking medications as prescribed, and following up with medical professionals if any issues arise.    Follow Up Instructions: Clinician will send Webex link for next session. The Client was advised to call back or seek an in-person evaluation if the symptoms worsen or if the condition fails to improve as anticipated.     I provided 180 minutes of non-face-to-face time during this encounter.     Hilbert Odor, LCSW

## 2021-02-14 NOTE — Addendum Note (Signed)
Addended by: [name] on: 02/14/2021 01:23 PM   Modules accepted: Orders

## 2021-02-14 NOTE — Progress Notes (Addendum)
Virtual Visit via Video Note  I connected with [name] on 02/14/21 at  9:00 AM EDT by a video enabled telemedicine application and verified that I am speaking with the correct person using two identifiers.  Location: Patient; home Provider: Office   I discussed the limitations of evaluation and management by telemedicine and the availability of in person appointments. The patient expressed understanding and agreed to proceed.    I discussed the assessment and treatment plan with the patient. The patient was provided an opportunity to ask questions and all were answered. The patient agreed with the plan and demonstrated an understanding of the instructions.   The patient was advised to call back or seek an in-person evaluation if the symptoms worsen or if the condition fails to improve as anticipated.  I provided 15 minutes of non-face-to-face time during this encounter.   [name], NP    Psychiatric Initial Adult Assessment   Patient Identification: Liann Spaeth MRN:  [id number] Date of Evaluation:  02/14/2021 Referral Source: PHP  Chief Complaint:   Visit Diagnosis:    ICD-10-CM   1. Bipolar 2 disorder (HCC)  F31.81       History of Present Illness: per admission assessment note: Carolynne Schuchard is a 35 y.o. female presents to [facility] urgent care seeking a full evaluation of her mental health.  Stated "I think I may have been on the autistic spectrum/Asperger".  Reports [facility] of bipolar disorder.  States she was prescribed  a mood stabilizer which made her depression worse. States she was diagnosed with ADHD during her childhood.  Current diagnoses major depressive disorder, anxiety and insomnia.  States she is recently changing providers to [facility].  Reported suicide attempt in 2018 where she completed a partial hospitalization program in [place].  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  Reece recently completed partial hospitalization programming.  She was initiated on Seroquel 100 mg p.o. nightly and 25 mg p.o. twice daily for mood stabilization.  Continues to deny suicidal or homicidal ideations.  Denies auditory or visual hallucinations.  Reports she continues to work on Pharmacologist for anxiety, mood swings and depression.  Reports ongoing symptoms of worry related to FMLA and finances.  Patient to start intensive outpatient programming 02/13/2021  Associated Signs/Symptoms: Depression Symptoms:  depressed mood, feelings of worthlessness/guilt, difficulty concentrating, hopelessness, (Hypo) Manic Symptoms:  Distractibility, Irritable Mood, Labiality of Mood, Anxiety Symptoms:  Excessive Worry, Psychotic Symptoms:     PTSD Symptoms: NA  Past Psychiatric History:   Previous Psychotropic Medications: Yes   Substance Abuse History in the last 12 months:  No.  Consequences of Substance Abuse: NA  Past Medical History:  Past Medical History:  Diagnosis Date   ADHD    Anxiety    Bipolar disorder (HCC)    Depression    Diabetes mellitus, type II (HCC)    Headache    Hypertension    Insomnia    PTSD (post-traumatic stress disorder)     Past Surgical History:  Procedure Laterality Date   TONSILLECTOMY Bilateral 2021    Family Psychiatric History:  Family History: History reviewed. No pertinent family history.  Social History:   Social History   Socioeconomic History   Marital status: Divorced    Spouse name: Not on file   Number of children: Not on file   Years of education: Not on file  Highest education level: High school graduate  Occupational History   Not on file  Tobacco Use   Smoking status: Never   Smokeless tobacco: Never  Vaping Use   Vaping Use: Never used  Substance and Sexual Activity   Alcohol use: Yes    Comment: weekly   Drug use: Yes    Types: Marijuana   Sexual activity: Not on file   Other Topics Concern   Not on file  Social History Narrative   Not on file   Social Determinants of Health   Financial Resource Strain: Not on file  Food Insecurity: Not on file  Transportation Needs: Not on file  Physical Activity: Not on file  Stress: Not on file  Social Connections: Not on file    Additional Social History:   Allergies:  No Known Allergies  Metabolic Disorder Labs: No results found for: HGBA1C, MPG No results found for: PROLACTIN No results found for: CHOL, TRIG, HDL, CHOLHDL, VLDL, LDLCALC No results found for: TSH  Therapeutic Level Labs: No results found for: LITHIUM No results found for: CBMZ No results found for: VALPROATE  Current Medications: Current Outpatient Medications  Medication Sig Dispense Refill   ALPRAZolam (XANAX) 0.5 MG tablet 1 tablet     amphetamine-dextroamphetamine (ADDERALL XR) 20 MG 24 hr capsule 10 mg.     Dulaglutide (TRULICITY) 0.75 MG/0.5ML SOPN See admin instructions.     DULoxetine (CYMBALTA) 20 MG capsule Take 1 capsule (20 mg total) by mouth daily. 30 capsule 0   Eszopiclone 3 MG TABS 1 tablet immediately before bedtime     levonorgestrel (MIRENA, 52 MG,) 20 MCG/24HR IUD      lisinopril-hydrochlorothiazide (ZESTORETIC) 20-12.5 MG tablet 1 tablet     prazosin (MINIPRESS) 1 MG capsule Take 1 capsule (1 mg total) by mouth at bedtime. 30 capsule 0   QUEtiapine (SEROQUEL) 100 MG tablet Take 1 tablet (100 mg total) by mouth at bedtime. 30 tablet 0   QUEtiapine (SEROQUEL) 100 MG tablet Take 1 tablet (100 mg total) by mouth 2 (two) times daily. 30 tablet 0   rizatriptan (MAXALT-MLT) 10 MG disintegrating tablet 1 tablet     topiramate (TOPAMAX) 25 MG tablet 1 tablet     No current facility-administered medications for this visit.    Musculoskeletal: Strength & Muscle Tone: within normal limits Gait & Station: normal Patient leans: N/A  Psychiatric Specialty Exam: Review of Systems  There were no vitals taken for  this visit.There is no height or weight on file to calculate BMI.  General Appearance: Casual  Eye Contact:  Good  Speech:  Clear and Coherent  Volume:  Normal  Mood:  Anxious and Depressed  Affect:  Congruent  Thought Process:  Coherent  Orientation:  Full (Time, Place, and Person)  Thought Content:  Logical  Suicidal Thoughts:  No  Homicidal Thoughts:  No  Memory:  Immediate;   Fair  Judgement:  Good  Insight:  Good  Psychomotor Activity:  Normal  Concentration:  Concentration: Fair  Recall:  Good  Fund of Knowledge:Good  Language: Good  Akathisia:  Yes  Handed:  Left  AIMS (if indicated):  done  Assets:  Communication Skills Desire for Improvement Social Support  ADL's:  Intact  Cognition: WNL  Sleep:  Fair   Screenings: Insurance account manager from 02/09/2021 in BEHAVIORAL HEALTH INTENSIVE PSYCH Counselor from 02/07/2021 in BEHAVIORAL HEALTH PARTIAL HOSPITALIZATION PROGRAM Counselor from 01/18/2021 in BEHAVIORAL HEALTH PARTIAL HOSPITALIZATION PROGRAM  PHQ-2 Total  Score 4 4 5   PHQ-9 Total Score 15 18 21       Flowsheet Row Counselor from 02/09/2021 in BEHAVIORAL HEALTH INTENSIVE PSYCH Counselor from 02/07/2021 in BEHAVIORAL HEALTH PARTIAL HOSPITALIZATION PROGRAM ED from 12/06/2020 in [facility]  C-SSRS RISK CATEGORY Error: Q3, 4, or 5 should not be populated when Q2 is No Error: Question 6 not populated No Risk       Assessment and Plan:  Zonia Caplin to start Intensive Outpatient Programing  Patient to  continue medications as directed  Cymbalta 20mg  , Seroquel 100 mg po QHS and 25 mg BID and minipress 1 mg po daily  was refilled on admission   Treatment plan was reviewed and agreed upon by NP T. 02/05/2021 and patient Avalina Benko need for group services.   , NP 6/14/202212:52 PM

## 2021-02-15 ENCOUNTER — Other Ambulatory Visit: Payer: Self-pay

## 2021-02-15 ENCOUNTER — Other Ambulatory Visit (HOSPITAL_COMMUNITY): Payer: 59 | Admitting: Psychiatry

## 2021-02-15 ENCOUNTER — Encounter (HOSPITAL_COMMUNITY): Payer: Self-pay

## 2021-02-15 DIAGNOSIS — F329 Major depressive disorder, single episode, unspecified: Secondary | ICD-10-CM | POA: Diagnosis not present

## 2021-02-15 DIAGNOSIS — F3181 Bipolar II disorder: Secondary | ICD-10-CM

## 2021-02-15 NOTE — Progress Notes (Signed)
Virtual Visit via Video Note  I connected with [name] on 02/15/21 at  9:00 AM EDT by a video enabled telemedicine application and verified that I am speaking with the correct person using two identifiers.  At orientation to the IOP program, Case Manager discussed the limitations of evaluation and management by telemedicine and the availability of in person appointments. The patient expressed understanding and agreed to proceed with virtual visits throughout the duration of the program.   Location:  Patient: Patient Home Provider: Home Office   History of Present Illness: Bipolar 2 DO  Observations/Objective: Check In: Case Manager checked in with all participants to review discharge dates, insurance authorizations, work-related documents and needs from the treatment team regarding medications. Client stated needs and engaged in discussion. Client presents with moderate depression and moderate anxiety. Client denied any current SI/HI/psychosis.  Initial Therapeutic Activity: Counselor introduced our guest speaker, Peggye Fothergill, [facility] Pharmacist, who shared about psychiatric medications, side effects, treatment considerations and how to communicate with medical professionals. Group Members asked questions and shared medication concerns. Counselor prompted group members to reference a worksheet called, "Body Scan" to jot down questions and concerns about their physical health in preparation for their upcoming appointments with medical professionals. Counselor encouraged routine medical check-ups, preparing for appointments, following up with recommendations and seeking specialist if needed.  Second Therapeutic Activity: Counselor introduced Chief Operating Officer, Clyda Greener, Director of Wellness to present information on [employer] and Benefits. Clients engaged in activities and discussion, personalizing the information to their individual circumstances. Client motivated to make small  changes in wellness practices to improve overall mental health.  Check Out: Counselor prompted group members to share what self-care practice or productivity activity they will engage in today. Group members shared their plans with the group. Client endorsed safety plan to be followed to prevent safety issues.   Assessment and Plan: Clinician recommends that Client remain in IOP treatment to better manage mental health symptoms, stabilization and to address treatment plan goals. Clinician recommends adherence to crisis/safety plan, taking medications as prescribed, and following up with medical professionals if any issues arise.    Follow Up Instructions: Clinician will send Webex link for next session. The Client was advised to call back or seek an in-person evaluation if the symptoms worsen or if the condition fails to improve as anticipated.     I provided 180 minutes of non-face-to-face time during this encounter.     Savannah Odor, LCSW

## 2021-02-16 ENCOUNTER — Other Ambulatory Visit: Payer: Self-pay

## 2021-02-16 ENCOUNTER — Encounter (HOSPITAL_COMMUNITY): Payer: Self-pay | Admitting: Psychiatry

## 2021-02-16 ENCOUNTER — Other Ambulatory Visit (HOSPITAL_COMMUNITY): Payer: 59 | Admitting: Psychiatry

## 2021-02-16 ENCOUNTER — Telehealth (HOSPITAL_COMMUNITY): Payer: Self-pay | Admitting: *Deleted

## 2021-02-16 DIAGNOSIS — F3181 Bipolar II disorder: Secondary | ICD-10-CM

## 2021-02-16 DIAGNOSIS — F329 Major depressive disorder, single episode, unspecified: Secondary | ICD-10-CM | POA: Diagnosis not present

## 2021-02-16 NOTE — Progress Notes (Signed)
Virtual Visit via Video Note  I connected with [name] on 02/16/21 at  9:00 AM EDT by a video enabled telemedicine application and verified that I am speaking with the correct person using two identifiers.  At orientation to the IOP program, Case Manager discussed the limitations of evaluation and management by telemedicine and the availability of in person appointments. The patient expressed understanding and agreed to proceed with virtual visits throughout the duration of the program.   Location:  Patient: Patient Home Provider: Home Office   History of Present Illness: Bipolar 2 DO  Observations/Objective: Check In: Case Manager checked in with all participants to review discharge dates, insurance authorizations, work-related documents and needs from the treatment team regarding medications. Client stated needs and engaged in discussion.   Initial Therapeutic Activity: Counselor introduced Sheppard Coil, [place] Chaplain to present information and discussion on Grief and Loss. Group members engaged in discussion, sharing how grief impacts them, what comforts them, what emotions are felt, labeling losses, etc. After guest speaker logged off, Counselor prompted group to spend 15 minutes journaling to process personal grief and loss situations. Counselor processed entries with group and client's identified areas for additional processing in individual therapy.   Second Therapeutic Activity: Counselor facilitated a check-in with group members to assess mood and current functioning. Client shared details of their mental health management since our last session, including challenges and successes. Counselor engaged group in discussion, covering the following topics: health and wellness goals, community resources, distraction activities, reducing unhealthy habits, self-care, and structuring day for wellness. Client presents with moderate depression and moderate anxiety. Client denied any  current SI/HI/psychosis.  Check Out: Counselor prompted group members to share what self-care practice or productivity activity they will engage in today. Group members shared their plans with the group. Client endorsed safety plan to be followed to prevent safety issues.   Assessment and Plan: Clinician recommends that Client remain in IOP treatment to better manage mental health symptoms, stabilization and to address treatment plan goals. Clinician recommends adherence to crisis/safety plan, taking medications as prescribed, and following up with medical professionals if any issues arise.    Follow Up Instructions: Clinician will send Webex link for next session. The Client was advised to call back or seek an in-person evaluation if the symptoms worsen or if the condition fails to improve as anticipated.     I provided 180 minutes of non-face-to-face time during this encounter.     Hilbert Odor, LCSW

## 2021-02-16 NOTE — Telephone Encounter (Signed)
RX REFILL REQUEST HAS PROVIDER AS TANIKA LEWIS @ THE [place] [facility]DR[facility] WITH CORRECT FAX#. FAXED REFILL REQUEST TO THE [place] OFFICE AS PROVIDER DOESN'T WORK IN THIS OFFICE.

## 2021-02-17 ENCOUNTER — Other Ambulatory Visit: Payer: Self-pay

## 2021-02-17 ENCOUNTER — Other Ambulatory Visit (HOSPITAL_COMMUNITY): Payer: 59 | Admitting: Psychiatry

## 2021-02-17 ENCOUNTER — Encounter (HOSPITAL_COMMUNITY): Payer: Self-pay | Admitting: Psychiatry

## 2021-02-17 DIAGNOSIS — F3181 Bipolar II disorder: Secondary | ICD-10-CM

## 2021-02-17 DIAGNOSIS — F329 Major depressive disorder, single episode, unspecified: Secondary | ICD-10-CM | POA: Diagnosis not present

## 2021-02-17 MED ORDER — PRAZOSIN HCL 1 MG PO CAPS: 1.0000 mg | ORAL_CAPSULE | Freq: Every day | ORAL | 0 refills | Status: AC

## 2021-02-17 NOTE — Progress Notes (Signed)
Virtual Visit via Video Note  I connected with Savannah [name] on 02/17/21 at  9:00 AM EDT by a video enabled telemedicine application and verified that I am speaking with the correct person using two identifiers.  At orientation to the IOP program, Case Manager discussed the limitations of evaluation and management by telemedicine and the availability of in person appointments. The patient expressed understanding and agreed to proceed with virtual visits throughout the duration of the program.   Location:  Patient: Patient Home Provider: Home Office   History of Present Illness: Bipolar 2 DO  Observations/Objective: Check In: Case Manager checked in with all participants to review discharge dates, insurance authorizations, work-related documents and needs from the treatment team regarding medications. Client stated needs and engaged in discussion.   Initial Therapeutic Activity: Counselor facilitated a check-in with group members to assess mood and current functioning. Client shared details of their mental health management since our last session, including challenges and successes. Counselor engaged group in discussion, covering the following topics: sleep hygiene, utilization of coping skills, meditation, mindful walks, yoga, accountability, talking with providers, self-care guilt, ECT vs TMS, substance use and its impact on mental health, and setting boundaries. Client presents with moderate depression and moderate anxiety. Client denied any current SI/HI/psychosis.   Second Therapeutic Activity: Counselor opened discussion and explored challenges and thoughts around Father's Day/relationship with fathers. Group members took time to journal thoughts and feelings. Counselor prompted group members to process aloud, discussing ways to manage grief and loss, celebrate relationships and attend to feelings/memories that arise.   Check Out: Counselor prompted group members to share what self-care  practice or productivity activity they will engage in over the weekend. Group members shared their plans with the group. Client endorsed safety plan to be followed to prevent safety issues.   Assessment and Plan: Clinician recommends that Client remain in IOP treatment to better manage mental health symptoms, stabilization and to address treatment plan goals. Clinician recommends adherence to crisis/safety plan, taking medications as prescribed, and following up with medical professionals if any issues arise.    Follow Up Instructions: Clinician will send Webex link for next session. The Client was advised to call back or seek an in-person evaluation if the symptoms worsen or if the condition fails to improve as anticipated.     I provided 180 minutes of non-face-to-face time during this encounter.     Hilbert Odor, LCSW

## 2021-02-17 NOTE — Progress Notes (Signed)
Medications was refilled ie Minipress 1 mg.   Patient reported that she was recently seen and evaluated by her new psychiatrist and was prdx Abilify 5 mg and was told to discontinue Cymbalta 20 mg.  Patient to Continue Seroquel 25 mg po BID and 100mg  po  QHS.

## 2021-02-20 ENCOUNTER — Other Ambulatory Visit: Payer: Self-pay

## 2021-02-20 ENCOUNTER — Other Ambulatory Visit (HOSPITAL_COMMUNITY): Payer: 59 | Admitting: Psychiatry

## 2021-02-20 ENCOUNTER — Encounter (HOSPITAL_COMMUNITY): Payer: Self-pay | Admitting: Psychiatry

## 2021-02-20 DIAGNOSIS — F329 Major depressive disorder, single episode, unspecified: Secondary | ICD-10-CM | POA: Diagnosis not present

## 2021-02-20 DIAGNOSIS — F3181 Bipolar II disorder: Secondary | ICD-10-CM

## 2021-02-20 NOTE — Patient Instructions (Signed)
D:  Pt will discharge on 02-23-21 as planned.  A:  Discharge on 02-23-21.  Follow up with [facility] providers (therapist and psychiatrist).  Pt plans to call for appointment.  R:  Patient receptive.

## 2021-02-20 NOTE — Progress Notes (Signed)
Virtual Visit via Video Note  I connected with Savannah [name] on 02/20/21 at  9:00 AM EDT by a video enabled telemedicine application and verified that I am speaking with the correct person using two identifiers.  At orientation to the IOP program, Case Manager discussed the limitations of evaluation and management by telemedicine and the availability of in person appointments. The patient expressed understanding and agreed to proceed with virtual visits throughout the duration of the program.   Location:  Patient: Patient Home Provider: Home Office   History of Present Illness: Bipolar 2 DO  Observations/Objective: Check In: Case Manager checked in with all participants to review discharge dates, insurance authorizations, work-related documents and needs from the treatment team regarding medications. Client stated needs and engaged in discussion.   Initial Therapeutic Activity: Counselor facilitated a check-in with group members to assess mood and current functioning. Client shared details of their mental health management since our last session, including challenges and successes. Counselor engaged group in discussion, covering the following topics: father's day grief responses and reminders, TIPP DBT skills, flashbacks, memory loss issues, skill application, and using Savannah in coping. Client presents with moderate depression and moderate anxiety. Client denied any current SI/HI/psychosis.   Second Therapeutic Activity: Counselor engaged group in the creation of their current ECOmap, in order to have a snapshot of support systems and relationship with entities/individuals involved in. Counselor walked group through the steps of the activity creating dynamic lines, energy lines and boundary types. Group then shared reflections and results of their work. Client shared and identified 2-3 small goals they would like in addressing issues to reduce stress and stabilize relationships.   Check Out:  Counselor closed program by allowing time to celebrate a graduating group member. Counselor shared reflections on progress and allow space for group members to share well wishes and encouragements with the graduating client. Counselor prompted graduating client to share takeaways, reflect on progress and final thoughts for the group. Counselor prompted group members to share what self-care practice or productivity activity they will engage in today. Group members shared their plans with the group. Client endorsed safety plan to be followed to prevent safety issues.   Assessment and Plan: Clinician recommends that Client remain in IOP treatment to better manage mental health symptoms, stabilization and to address treatment plan goals. Clinician recommends adherence to crisis/safety plan, taking medications as prescribed, and following up with medical professionals if any issues arise.    Follow Up Instructions: Clinician will send Webex link for next session. The Client was advised to call back or seek an in-person evaluation if the symptoms worsen or if the condition fails to improve as anticipated.     I provided 180 minutes of non-face-to-face time during this encounter.     Hilbert Odor, LCSW

## 2021-02-20 NOTE — Progress Notes (Signed)
Virtual Visit via Video Note  I connected with Savannah [name] on @TODAY @ at  9:00 AM EDT by a video enabled telemedicine application and verified that I am speaking with the correct person using two identifiers.  Location: Patient: at home Provider: at office   I discussed the limitations of evaluation and management by telemedicine and the availability of in person appointments. The patient expressed understanding and agreed to proceed.   I discussed the assessment and treatment plan with the patient. The patient was provided an opportunity to ask questions and all were answered. The patient agreed with the plan and demonstrated an understanding of the instructions.   The patient was advised to call back or seek an in-person evaluation if the symptoms worsen or if the condition fails to improve as anticipated.  I provided 20 minutes of non-face-to-face time during this encounter.   Savannah [name], Savannah [name], M.Ed,CNA  D:  As per admission assessment notes: Savannah [name] is a 35 y.o. female presents to [facility] urgent care seeking a full evaluation of her mental health.  Stated "I think I may have been on the autistic spectrum/Asperger".  Reports [facility] with a diagnosis of bipolar disorder.  States she was prescribed  a mood stabilizer which made her depression worse. States she was diagnosed with ADHD during her childhood.  Current diagnoses major depressive disorder, anxiety and insomnia.  States she is recently changing providers to [facility].  Reported suicide attempt in 2018 where she completed a partial hospitalization program in 2019.  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   Pt transitioned from PHP to MH-IOP today.  Attended PHP from 01-18-21 thru 02-08-21.  Reports that the groups were helpful.  On a scale of 1-10 (10 being the worst); pt rates her depression and anxiety at a 6.  Denies SI/HI or A/V  hallucinations.  Continues to c/o of worrying about her finances.    Patient attended all scheduled days.  Pt will be discharging on 02-23-21 as planned.  Pt has gallbladder surgery on 02-24-21; therefore that's why she requested to stay one more day.  States she is in a "much better head space."  Reports being anxious about RTW.  C/O continuing to struggle with PTSD and anxiety symptoms. On a scale of 1-20 (10 being the worst); pt rated her anxiety at a 5 and her depression at a 4.  Denies SI/HI or A/V hallucinations.  A:  D/C 02-23-21.  F/U with [facility] (therapist and psychiatrist).  Strongly recommended DBT.  R:  Pt receptive.  02-14-1992, M.Ed, CNA

## 2021-02-21 ENCOUNTER — Other Ambulatory Visit (HOSPITAL_COMMUNITY): Payer: 59

## 2021-02-21 ENCOUNTER — Other Ambulatory Visit: Payer: Self-pay

## 2021-02-22 ENCOUNTER — Other Ambulatory Visit (HOSPITAL_BASED_OUTPATIENT_CLINIC_OR_DEPARTMENT_OTHER): Payer: 59 | Admitting: Family

## 2021-02-22 ENCOUNTER — Other Ambulatory Visit: Payer: Self-pay

## 2021-02-22 DIAGNOSIS — F3181 Bipolar II disorder: Secondary | ICD-10-CM | POA: Diagnosis not present

## 2021-02-22 DIAGNOSIS — F331 Major depressive disorder, recurrent, moderate: Secondary | ICD-10-CM | POA: Diagnosis not present

## 2021-02-22 NOTE — Progress Notes (Signed)
Virtual Visit via Video Note  I connected with Savannah [name] on 02/22/21 at  9:00 AM EDT by a video enabled telemedicine application and verified that I am speaking with the correct person using two identifiers.  Location: Patient: Home Provider: Office   I discussed the limitations of evaluation and management by telemedicine and the availability of in person appointments. The patient expressed understanding and agreed to proceed.    I discussed the assessment and treatment plan with the patient. The patient was provided an opportunity to ask questions and all were answered. The patient agreed with the plan and demonstrated an understanding of the instructions.   The patient was advised to call back or seek an in-person evaluation if the symptoms worsen or if the condition fails to improve as anticipated.  I provided 15 minutes of non-face-to-face time during this encounter.   Savannah Rack, NP   [facility] Intensive Outpatient Program Discharge Summary  Savannah [name] [id number]  Admission date: 02/08/2021 Discharge date: 02/22/2021  Reason for admission: Per admission assessment note: Savannah [name] is a 35 y.o. female presents to [facility] urgent care seeking a full evaluation of her mental health.  Stated "I think I may have been on the autistic spectrum/Asperger".  Reports she was [facility] with a diagnosis of bipolar disorder.  States she was prescribed  a mood stabilizer which made her depression worse. States she was diagnosed with ADHD during her childhood.  Current diagnoses major depressive disorder, anxiety and insomnia.  States she is recently changing providers to [facility].  Reported suicide attempt in 2018 where she completed a partial hospitalization program in [place].  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   Progress in Program Toward Treatment Goals:  Savannah [name] has completed  partial hospitalization programming and intensive outpatient programming.  Continues to deny suicidal or homicidal ideations.  Denies auditory or visual hallucinations.  Patient was followed by a new provider who initiated Abilify to patient's current medication regimen.  Patient is to continue Seroquel 100 mg p.o. nightly.  Patient to discharge and keep follow-up with the all outpatient providers.   Progress (rationale): Patient to follow-up with [facility].    Take all medications as prescribed. Keep all follow-up appointments as scheduled.  Do not consume alcohol or use illegal drugs while on prescription medications. Report any adverse effects from your medications to your primary care provider promptly.  In the event of recurrent symptoms or worsening symptoms, call 911, a crisis hotline, or go to the nearest emergency department for evaluation.     Savannah Rack, NP 02/22/2021

## 2021-02-22 NOTE — Telephone Encounter (Signed)
RX REFILL REQUEST HAS PROVIDER AS TANIKA LEWIS @ THE [place] [address]DR[facility] WITH CORRECT FAX#. FAXED REFILL REQUEST TO THE [place] [facility] AS PROVIDER DOESN'T WORK IN THIS OFFICE. 

## 2021-02-23 ENCOUNTER — Other Ambulatory Visit (HOSPITAL_COMMUNITY): Payer: 59 | Admitting: Psychiatry

## 2021-02-23 ENCOUNTER — Other Ambulatory Visit: Payer: Self-pay

## 2021-02-23 DIAGNOSIS — F3181 Bipolar II disorder: Secondary | ICD-10-CM

## 2021-02-23 DIAGNOSIS — F329 Major depressive disorder, single episode, unspecified: Secondary | ICD-10-CM | POA: Diagnosis not present

## 2021-02-24 ENCOUNTER — Encounter (HOSPITAL_COMMUNITY): Payer: Self-pay | Admitting: Family

## 2021-02-24 ENCOUNTER — Other Ambulatory Visit (HOSPITAL_COMMUNITY): Payer: 59

## 2021-02-24 ENCOUNTER — Other Ambulatory Visit: Payer: Self-pay

## 2021-02-24 ENCOUNTER — Encounter (HOSPITAL_COMMUNITY): Payer: Self-pay

## 2021-02-24 NOTE — Progress Notes (Signed)
Virtual Visit via Video Note  I connected with Savannah [name] on 02/23/21 at  9:00 AM EDT by a video enabled telemedicine application and verified that I am speaking with the correct person using two identifiers.  At orientation to the IOP program, Case Manager discussed the limitations of evaluation and management by telemedicine and the availability of in person appointments. The patient expressed understanding and agreed to proceed with virtual visits throughout the duration of the program.   Location:  Patient: Patient Home Provider: Home Office   History of Present Illness: Bipolar 2 DO  Observations/Objective: Check In: Case Manager checked in with all participants to review discharge dates, insurance authorizations, work-related documents and needs from the treatment team regarding medications. Client stated needs and engaged in discussion. Counselor engaged group in discussion, covering the following topics: community resources, jobs that are MH accommodating, managing to do lists and life transitions. Client presents with moderate depression and moderate anxiety. Client denied any current SI/HI/psychosis.  Initial Therapeutic Activity: Counselor introduced Sheppard Coil, [place] Chaplain to present information and discussion on Grief and Loss. Group members engaged in discussion, sharing how grief impacts them, what comforts them, what emotions are felt, labeling losses, etc. After guest speaker logged off, Counselor prompted group to spend 10-15 minutes journaling to process personal grief and loss situations. Counselor processed entries with group and client's identified areas for additional processing in individual therapy. Client noted their individual issues of grief and how they are coping.   Second Therapeutic Activity: Counselor introduced [employer], representative with [employer] to share about programming. Group Members asked questions and engaged in discussion, as  Trinna Post shared about Peer Support, Support Groups and the [employer]. Client stated that they are interested in connecting with the peer support.  Check Out: Counselor closed program by allowing time to celebrate two graduating group members. Counselor shared reflections on progress and allow space for group members to share well wishes and encouragements with the graduating client. Counselor prompted graduating client to share takeaways, reflect on progress and final thoughts for the group. Client endorsed safety plan to be followed to prevent safety issues.   Assessment and Plan: Clinician recommends that Client step down to individual therapy, support groups and medication management. Clinician recommends adherence to crisis/safety plan, taking medications as prescribed, and following up with medical professionals if any issues arise.    Follow Up Instructions: Clinician will send Webex link for next session. The Client was advised to call back or seek an in-person evaluation if the symptoms worsen or if the condition fails to improve as anticipated.     I provided 180 minutes of non-face-to-face time during this encounter.     Hilbert Odor, LCSW

## 2021-02-27 ENCOUNTER — Ambulatory Visit (HOSPITAL_COMMUNITY): Payer: 59

## 2021-03-04 ENCOUNTER — Other Ambulatory Visit: Payer: Self-pay

## 2021-03-04 ENCOUNTER — Ambulatory Visit (HOSPITAL_COMMUNITY)
Admission: EM | Admit: 2021-03-04 | Discharge: 2021-03-04 | Disposition: A | Discharge status: Home / Self Care | Payer: 59

## 2021-03-04 DIAGNOSIS — F319 Bipolar disorder, unspecified: Secondary | ICD-10-CM

## 2021-03-04 NOTE — Discharge Summary (Signed)
[name] to be D/C'd Home per MD order. Discussed with the patient and all questions fully answered. An After Visit Summary was printed and given to the patient.  Patient escorted out and D/C home via private auto.  Dickie La  03/04/2021 9:50 AM

## 2021-03-04 NOTE — Discharge Instructions (Addendum)
Take all medications as prescribed. Keep all follow-up appointments as scheduled.  Do not consume alcohol or use illegal drugs while on prescription medications. Report any adverse effects from your medications to your primary care provider promptly.  In the event of recurrent symptoms or worsening symptoms, call 911, a crisis hotline, or go to the nearest emergency department for evaluation.   

## 2021-03-04 NOTE — BH Assessment (Signed)
Pt to [facility] needing a prescription for Abilify. Reports pharmacy unable to give medications until Tuesday. Pt stated that she did not want to go weekend without meds and wants to know if provider can send prescription to a different pharmacy. Denies SI, HI, AVH.   Pt is routine

## 2021-03-04 NOTE — ED Provider Notes (Signed)
   Behavioral Health Urgent Care Medical Screening Exam  Patient Name: Savannah [name] MRN: [id number] Date of Evaluation: 03/04/21 Chief Complaint:   Diagnosis:  Final diagnoses:  Bipolar I disorder (HCC)    History of Present illness: Savannah [name] is a 35 y.o. female. Presents to [facility] urgent care requesting medications. Stated that she forgot to pick up her prescriptions before the pharmacy closed and due to the long weekend she would be off her medication for 5 days.   Savannah [name] denied suicidal or homicidal ideations.  Denies auditory or visual hallucinations.  Patient was recently discharged from intensive outpatient programming and is well-known to this provider.  We will make medications available for pickup.  (Abilify 10 mg).Support, encouragement and  reassurance was provided.  Psychiatric Specialty Exam  Presentation  General Appearance:Appropriate for Environment  Eye Contact:Good  Speech:Clear and Coherent  Speech Volume:Normal  Handedness:Right   Mood and Affect  Mood:Anxious  Affect:Congruent   Thought Process  Thought Processes:Coherent  Descriptions of Associations:Intact  Orientation:Full (Time, Place and Person)  Thought Content:Logical  Diagnosis of Schizophrenia or Schizoaffective disorder in past: No   Hallucinations:None  Ideas of Reference:None  Suicidal Thoughts:No  Homicidal Thoughts:No   Sensorium  Memory:Immediate Fair; Recent Fair; Remote Fair  Judgment:Fair  Insight:Fair   Executive Functions  Concentration:Fair  Attention Span:Fair  Recall:Fair  Fund of Knowledge:Good  Language:Good   Psychomotor Activity  Psychomotor Activity:Normal   Assets  Assets:Desire for Improvement; Social Support   Sleep  Sleep:Poor  Number of hours: 5   No data recorded  Physical Exam: Physical Exam Cardiovascular:     Rate and Rhythm: Normal rate.  Neurological:     Mental Status: She is alert and  oriented to person, place, and time.  Psychiatric:        Attention and Perception: Attention normal.        Mood and Affect: Mood normal.        Speech: Speech normal.        Behavior: Behavior normal.        Thought Content: Thought content normal.        Cognition and Memory: Cognition normal.        Judgment: Judgment normal.   Review of Systems  Eyes: Negative.   Cardiovascular: Negative.   Musculoskeletal: Negative.   Skin: Negative.   Endo/Heme/Allergies: Negative.   Psychiatric/Behavioral:  Positive for depression. Negative for hallucinations and suicidal ideas. The patient is nervous/anxious. The patient does not have insomnia.   All other systems reviewed and are negative. Blood pressure 104/65, pulse 89, temperature 98.1 F (36.7 C), temperature source Oral, resp. rate 18, height 5\' 1"  (1.549 m), weight 189 lb (85.7 kg), SpO2 99 %. Body mass index is 35.71 kg/m.  Musculoskeletal: Strength & Muscle Tone: within normal limits Gait & Station: normal Patient leans: N/A   [facility] MSE Discharge Disposition for Follow up and Recommendations: Keep follow-up with all outpatient providers NP called [facility] at the [address] [facility]. Abilify 10 mg  to be transferred and filled at this location.  Patient made aware.   [place], NP 03/04/2021, 9:44 AM

## 2021-03-07 ENCOUNTER — Other Ambulatory Visit (HOSPITAL_COMMUNITY): Payer: Self-pay | Admitting: Family

## 2021-03-21 ENCOUNTER — Other Ambulatory Visit: Payer: Self-pay

## 2021-03-21 ENCOUNTER — Ambulatory Visit (INDEPENDENT_AMBULATORY_CARE_PROVIDER_SITE_OTHER): Payer: 59 | Admitting: Psychiatry

## 2021-03-21 DIAGNOSIS — F3181 Bipolar II disorder: Secondary | ICD-10-CM | POA: Diagnosis not present

## 2021-03-22 ENCOUNTER — Encounter (HOSPITAL_COMMUNITY): Payer: Self-pay | Admitting: Psychiatry

## 2021-03-22 NOTE — Progress Notes (Signed)
Virtual Visit via Video Note  I connected with Oren Section on 03/21/21 at 12:00 PM EDT by a video enabled telemedicine application and verified that I am speaking with the correct person using two identifiers.  I discussed the limitations of evaluation and management by telemedicine and the availability of in person appointments. The patient expressed understanding and agreed to proceed.    Location: Patient: Patient Home Provider: Home Office   GROUP GOAL: Client will attend IOP Aftercare Group Therapy 2-4x a month to connect with peers and to apply strategies discussed to improve mental health condition.  History of Present Illness: Bipolar 2 DO  Observations/Objective: Counselor met with Patient in the context of Group Therapy, via Webex. Counselor prompted Patient to share updates on coping skill application and individual therapeutic process in management of mental health. Client reports intentional efforts to maintain mental health.  Counselor prompted Patient to share areas of concern, challenges and identify barriers to meeting current goals. Group topics covered: group restructuring, grief and loss, helpful mental health apps, preventing panic attacks. Self-care, utilizing community support groups and other healthy outlets.  Patient engaged in discussion, provided feedback for others within the group and took note of additional strategies reviewed and discussed.   Assessment and Plan: Counselor recommends client continue following treatment plan goals, following crisis plan and following up with behavioral health and medical providers as needed. Patient is welcome to join group at next session.   Follow Up Instructions: Counselor to provide link for next session via Webex platform. The patient was advised to call back or seek an in-person evaluation if the symptoms worsen or if the condition fails to improve as anticipated.  I provided 70 minutes of non-face-to-face time during  this encounter.   Lise Auer, LCSW

## 2021-04-11 ENCOUNTER — Ambulatory Visit (HOSPITAL_COMMUNITY): Payer: 59 | Admitting: Psychiatry

## 2021-04-11 ENCOUNTER — Other Ambulatory Visit: Payer: Self-pay

## 2021-04-11 DIAGNOSIS — F3181 Bipolar II disorder: Secondary | ICD-10-CM

## 2021-04-12 ENCOUNTER — Encounter (HOSPITAL_COMMUNITY): Payer: Self-pay | Admitting: Psychiatry

## 2021-04-12 NOTE — Progress Notes (Signed)
Group member contacted stating issues with being able to join group at set time. No current needs noted.   Raiden Haydu LCSW 

## 2021-05-02 ENCOUNTER — Other Ambulatory Visit: Payer: Self-pay

## 2021-05-02 ENCOUNTER — Ambulatory Visit (INDEPENDENT_AMBULATORY_CARE_PROVIDER_SITE_OTHER): Payer: 59 | Admitting: Psychiatry

## 2021-05-02 DIAGNOSIS — F3181 Bipolar II disorder: Secondary | ICD-10-CM

## 2021-05-09 ENCOUNTER — Encounter (HOSPITAL_COMMUNITY): Payer: Self-pay | Admitting: Psychiatry

## 2021-05-09 ENCOUNTER — Ambulatory Visit (HOSPITAL_COMMUNITY): Payer: 59 | Admitting: Psychiatry

## 2021-05-09 ENCOUNTER — Other Ambulatory Visit: Payer: Self-pay

## 2021-05-09 DIAGNOSIS — F3181 Bipolar II disorder: Secondary | ICD-10-CM

## 2021-05-09 NOTE — Progress Notes (Signed)
Virtual Visit via Video Note  I connected with Oren Section on 05/02/21 at  2:00 PM EDT by a video enabled telemedicine application and verified that I am speaking with the correct person using two identifiers.  I discussed the limitations of evaluation and management by telemedicine and the availability of in person appointments. The patient expressed understanding and agreed to proceed.    Location: Patient: Patient Home Provider: Home Office   GROUP GOAL: Client will attend IOP Aftercare Group Therapy 2-4x a month to connect with peers and to apply strategies discussed to improve mental health condition.  History of Present Illness: Bipolar 2 DO  Observations/Objective: Counselor met with Patient in the context of Group Therapy, via Webex. Counselor prompted Patient to share updates on coping skill application and individual therapeutic process in management of mental health. Client reports intentional efforts to maintain mental health.  Counselor prompted Patient to share areas of concern, challenges and identify barriers to meeting current goals. Group topics covered: specialized treatment process and outcomes, continuity of care, community resources, positive affirmations, grief work, boundaries, regaining self-confidence, combating depression and anxiety with coping strategies.  Patient engaged in discussion, provided feedback for others within the group and took note of additional strategies reviewed and discussed.   Assessment and Plan: Counselor recommends client continue following treatment plan goals, following crisis plan and following up with behavioral health and medical providers as needed. Patient is welcome to join group at next session.   Follow Up Instructions: Counselor to provide link for next session via Webex platform. The patient was advised to call back or seek an in-person evaluation if the symptoms worsen or if the condition fails to improve as anticipated.  I  provided 60 minutes of non-face-to-face time during this encounter.   Lise Auer, LCSW

## 2021-05-10 ENCOUNTER — Encounter (HOSPITAL_COMMUNITY): Payer: Self-pay | Admitting: Psychiatry

## 2021-05-10 NOTE — Progress Notes (Signed)
Client unable to participate in group today due to unforseen personal responsibilities. Client plans to attend next session.   Graycee Greeson, LCSW 

## 2021-08-10 DIAGNOSIS — J209 Acute bronchitis, unspecified: Secondary | ICD-10-CM | POA: Diagnosis not present

## 2021-09-19 DIAGNOSIS — F39 Unspecified mood [affective] disorder: Secondary | ICD-10-CM | POA: Diagnosis not present

## 2021-09-19 DIAGNOSIS — F419 Anxiety disorder, unspecified: Secondary | ICD-10-CM | POA: Diagnosis not present

## 2021-09-29 DIAGNOSIS — F39 Unspecified mood [affective] disorder: Secondary | ICD-10-CM | POA: Diagnosis not present

## 2021-09-29 DIAGNOSIS — F419 Anxiety disorder, unspecified: Secondary | ICD-10-CM | POA: Diagnosis not present

## 2021-09-29 DIAGNOSIS — F639 Impulse disorder, unspecified: Secondary | ICD-10-CM | POA: Diagnosis not present

## 2021-10-06 ENCOUNTER — Ambulatory Visit (HOSPITAL_COMMUNITY): Payer: 59 | Admitting: Psychiatry

## 2021-10-18 DIAGNOSIS — F39 Unspecified mood [affective] disorder: Secondary | ICD-10-CM | POA: Diagnosis not present

## 2021-10-18 DIAGNOSIS — F639 Impulse disorder, unspecified: Secondary | ICD-10-CM | POA: Diagnosis not present

## 2021-10-18 DIAGNOSIS — F419 Anxiety disorder, unspecified: Secondary | ICD-10-CM | POA: Diagnosis not present

## 2021-10-24 DIAGNOSIS — F419 Anxiety disorder, unspecified: Secondary | ICD-10-CM | POA: Diagnosis not present

## 2021-10-24 DIAGNOSIS — F39 Unspecified mood [affective] disorder: Secondary | ICD-10-CM | POA: Diagnosis not present

## 2021-10-24 DIAGNOSIS — F639 Impulse disorder, unspecified: Secondary | ICD-10-CM | POA: Diagnosis not present

## 2021-10-27 DIAGNOSIS — Z90721 Acquired absence of ovaries, unilateral: Secondary | ICD-10-CM | POA: Diagnosis not present

## 2021-10-27 DIAGNOSIS — Z975 Presence of (intrauterine) contraceptive device: Secondary | ICD-10-CM | POA: Diagnosis not present

## 2021-10-27 DIAGNOSIS — N83292 Other ovarian cyst, left side: Secondary | ICD-10-CM | POA: Diagnosis not present

## 2021-11-15 DIAGNOSIS — F39 Unspecified mood [affective] disorder: Secondary | ICD-10-CM | POA: Diagnosis not present

## 2021-11-15 DIAGNOSIS — F639 Impulse disorder, unspecified: Secondary | ICD-10-CM | POA: Diagnosis not present

## 2021-11-15 DIAGNOSIS — F419 Anxiety disorder, unspecified: Secondary | ICD-10-CM | POA: Diagnosis not present

## 2021-11-28 DIAGNOSIS — F39 Unspecified mood [affective] disorder: Secondary | ICD-10-CM | POA: Diagnosis not present

## 2021-11-28 DIAGNOSIS — F639 Impulse disorder, unspecified: Secondary | ICD-10-CM | POA: Diagnosis not present

## 2021-11-28 DIAGNOSIS — F419 Anxiety disorder, unspecified: Secondary | ICD-10-CM | POA: Diagnosis not present

## 2021-11-29 DIAGNOSIS — I498 Other specified cardiac arrhythmias: Secondary | ICD-10-CM | POA: Diagnosis not present

## 2021-11-29 DIAGNOSIS — Z9079 Acquired absence of other genital organ(s): Secondary | ICD-10-CM | POA: Diagnosis not present

## 2021-11-29 DIAGNOSIS — Z01818 Encounter for other preprocedural examination: Secondary | ICD-10-CM | POA: Diagnosis not present

## 2021-11-29 DIAGNOSIS — N83202 Unspecified ovarian cyst, left side: Secondary | ICD-10-CM | POA: Diagnosis not present

## 2021-11-29 DIAGNOSIS — Z30432 Encounter for removal of intrauterine contraceptive device: Secondary | ICD-10-CM | POA: Diagnosis not present

## 2021-11-29 DIAGNOSIS — Z90721 Acquired absence of ovaries, unilateral: Secondary | ICD-10-CM | POA: Diagnosis not present

## 2021-12-06 DIAGNOSIS — N83202 Unspecified ovarian cyst, left side: Secondary | ICD-10-CM | POA: Diagnosis not present

## 2021-12-06 DIAGNOSIS — N83292 Other ovarian cyst, left side: Secondary | ICD-10-CM | POA: Diagnosis not present

## 2021-12-06 DIAGNOSIS — N920 Excessive and frequent menstruation with regular cycle: Secondary | ICD-10-CM | POA: Diagnosis not present

## 2021-12-19 DIAGNOSIS — F39 Unspecified mood [affective] disorder: Secondary | ICD-10-CM | POA: Diagnosis not present

## 2021-12-19 DIAGNOSIS — F419 Anxiety disorder, unspecified: Secondary | ICD-10-CM | POA: Diagnosis not present

## 2021-12-19 DIAGNOSIS — F639 Impulse disorder, unspecified: Secondary | ICD-10-CM | POA: Diagnosis not present

## 2022-12-31 IMAGING — DX DG ABDOMEN 1V
2 series · 2 of 2 positions shown · non-contrast
Comparison: None.

CLINICAL DATA: No bowel movement 3 days.

EXAM:
ABDOMEN - 1 VIEW

[abdomen kub (1 of 2)]
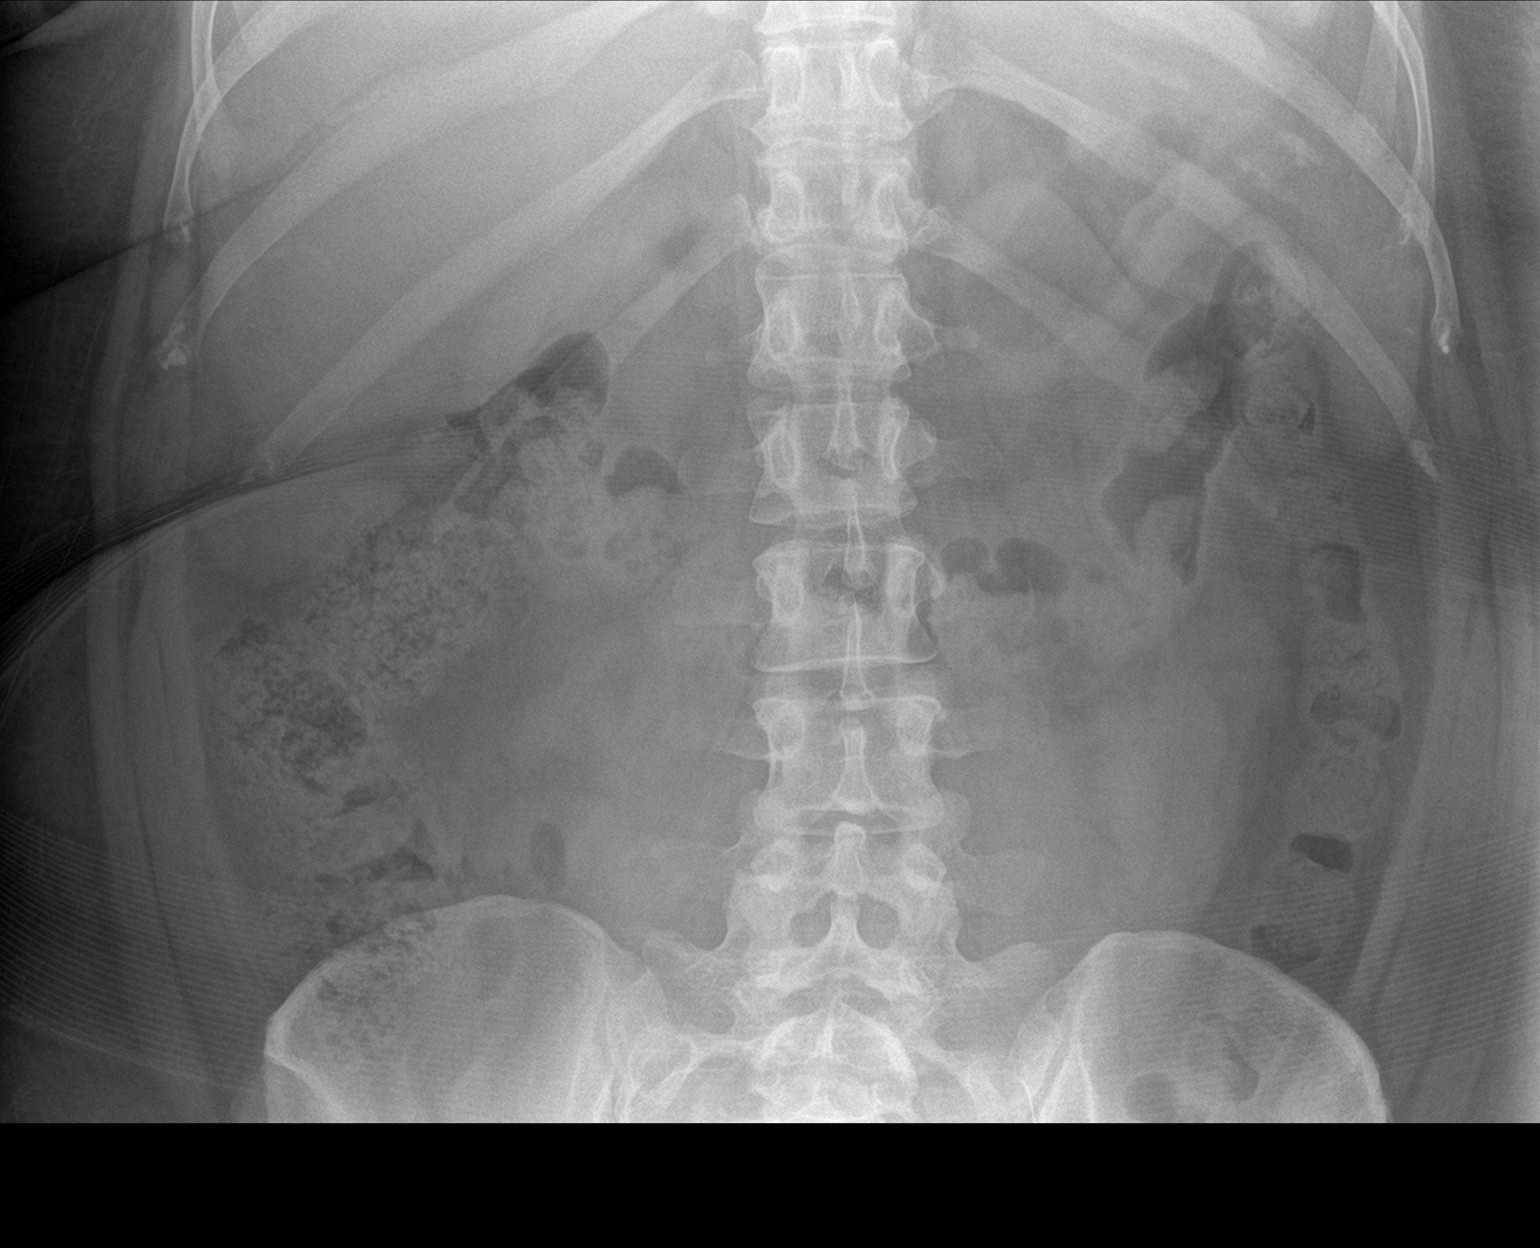

[abdomen kub (2 of 2)]
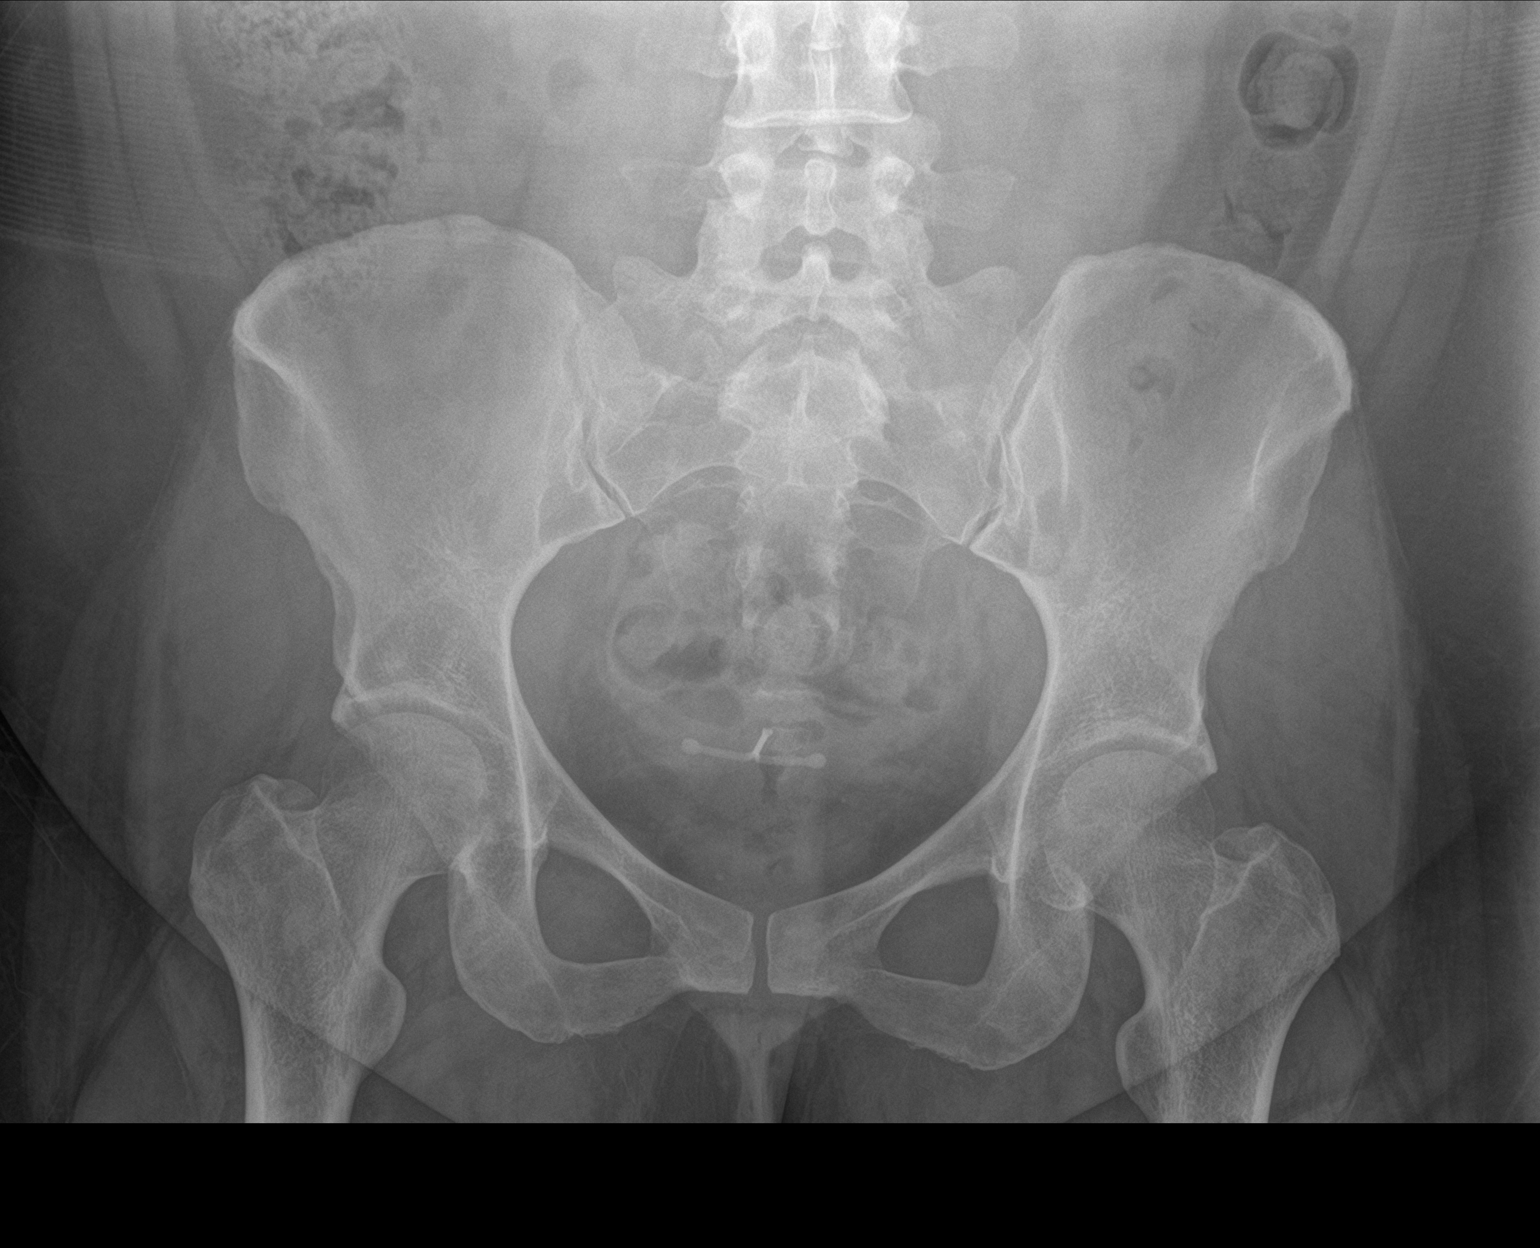

[2 of 2 positions shown; findings below may reference images not displayed]

FINDINGS: The bowel gas pattern is normal. No radio-opaque calculi or other
significant radiographic abnormality are seen. T-shaped intrauterine
device overlying the pelvis.
IMPRESSION: Negative.

## 2023-01-19 ENCOUNTER — Other Ambulatory Visit: Payer: Self-pay

## 2023-01-19 ENCOUNTER — Emergency Department (HOSPITAL_BASED_OUTPATIENT_CLINIC_OR_DEPARTMENT_OTHER)
Admission: EM | Admit: 2023-01-19 | Discharge: 2023-01-19 | Disposition: A | Discharge status: Home / Self Care | Payer: Medicaid Other | Attending: Emergency Medicine | Admitting: Emergency Medicine

## 2023-01-19 ENCOUNTER — Encounter (HOSPITAL_BASED_OUTPATIENT_CLINIC_OR_DEPARTMENT_OTHER): Payer: Self-pay

## 2023-01-19 DIAGNOSIS — Z79899 Other long term (current) drug therapy: Secondary | ICD-10-CM | POA: Diagnosis not present

## 2023-01-19 DIAGNOSIS — E860 Dehydration: Secondary | ICD-10-CM

## 2023-01-19 DIAGNOSIS — R112 Nausea with vomiting, unspecified: Secondary | ICD-10-CM | POA: Diagnosis present

## 2023-01-19 DIAGNOSIS — I10 Essential (primary) hypertension: Secondary | ICD-10-CM | POA: Diagnosis not present

## 2023-01-19 LAB — COMPREHENSIVE METABOLIC PANEL
ALT: 25 U/L (ref 0–44)
AST: 22 U/L (ref 15–41)
Albumin: 4.7 g/dL (ref 3.5–5.0)
Alkaline Phosphatase: 114 U/L (ref 38–126)
Anion gap: 14 (ref 5–15)
BUN: 12 mg/dL (ref 6–20)
CO2: 25 mmol/L (ref 22–32)
Calcium: 9.8 mg/dL (ref 8.9–10.3)
Chloride: 97 mmol/L — ABNORMAL LOW (ref 98–111)
Creatinine, Ser: 1.14 mg/dL — ABNORMAL HIGH (ref 0.44–1.00)
GFR, Estimated: >60 mL/min (ref >60)
Glucose, Bld: 147 mg/dL — ABNORMAL HIGH (ref 70–99)
Potassium: 4.5 mmol/L (ref 3.5–5.1)
Sodium: 136 mmol/L (ref 135–145)
Total Bilirubin: 1.4 mg/dL — ABNORMAL HIGH (ref 0.3–1.2)
Total Protein: 8.7 g/dL — ABNORMAL HIGH (ref 6.5–8.1)

## 2023-01-19 LAB — CBC
HCT: 41.8 % (ref 36.0–46.0)
Hemoglobin: 13.4 g/dL (ref 12.0–15.0)
MCH: 28.2 pg (ref 26.0–34.0)
MCHC: 32.1 g/dL (ref 30.0–36.0)
MCV: 88 fL (ref 80.0–100.0)
Platelets: 371 10*3/uL (ref 150–400)
RBC: 4.75 MIL/uL (ref 3.87–5.11)
RDW: 13.7 % (ref 11.5–15.5)
WBC: 10.8 10*3/uL — ABNORMAL HIGH (ref 4.0–10.5)
nRBC: 0 % (ref 0.0–0.2)

## 2023-01-19 LAB — URINALYSIS, ROUTINE W REFLEX MICROSCOPIC
Glucose, UA: NEGATIVE mg/dL
Hgb urine dipstick: NEGATIVE
Ketones, ur: 80 mg/dL — AB
Leukocytes,Ua: NEGATIVE
Nitrite: NEGATIVE
Protein, ur: 30 mg/dL — AB
Specific Gravity, Urine: 1.025 (ref 1.005–1.030)
pH: 5.5 (ref 5.0–8.0)

## 2023-01-19 LAB — URINALYSIS, MICROSCOPIC (REFLEX): RBC / HPF: NONE SEEN RBC/hpf (ref 0–5)

## 2023-01-19 LAB — PREGNANCY, URINE: Preg Test, Ur: NEGATIVE

## 2023-01-19 LAB — LIPASE, BLOOD: Lipase: 23 U/L (ref 11–51)

## 2023-01-19 MED ORDER — SODIUM CHLORIDE 0.9 % IV BOLUS
1000.0000 mL | Freq: Once | INTRAVENOUS | Status: AC
  Administered 2023-01-19: 1000 mL via INTRAVENOUS

## 2023-01-19 MED ORDER — ONDANSETRON HCL 4 MG/2ML IJ SOLN
4.0000 mg | Freq: Once | INTRAMUSCULAR | Status: AC
  Administered 2023-01-19: 4 mg via INTRAVENOUS
  Filled 2023-01-19: qty 2

## 2023-01-19 MED ORDER — ONDANSETRON 4 MG PO TBDP: 4.0000 mg | ORAL_TABLET | Freq: Three times a day (TID) | ORAL | 0 refills | Status: AC | PRN

## 2023-01-19 NOTE — ED Triage Notes (Signed)
Patient stated she started ozempic  Thursday. Now having  V/D

## 2023-01-19 NOTE — ED Provider Notes (Signed)
EMERGENCY DEPARTMENT AT [facility] Provider Note   CSN: [id number] Arrival date & time: 01/19/23  1127     History  Chief Complaint  Patient presents with   Emesis    Savannah [name] is a 37 y.o. female.  Pt is a 37 yo female with pmhx significant for anxiety, htn, depression, adhd, ptsd, bipolar d/o, and dm2.  Pt started taking Ozempic on 5/16.  She woke up this am around 0600 with n/v/d.  She has not been able to keep anything down.  No f/c.       Home Medications Prior to Admission medications   Medication Sig Start Date End Date Taking? Authorizing Provider  ondansetron (ZOFRAN-ODT) 4 MG disintegrating tablet Take 1 tablet (4 mg total) by mouth every 8 (eight) hours as needed for nausea or vomiting. 01/19/23  Yes Jacalyn Lefevre, MD  ALPRAZolam Prudy Feeler) 0.5 MG tablet 1 tablet 03/30/19   [provider]  amphetamine-dextroamphetamine (ADDERALL XR) 20 MG 24 hr capsule 10 mg. 03/04/20   [provider]  ARIPiprazole (ABILIFY) 5 MG tablet Take 5 mg by mouth daily.    [provider]  Dulaglutide (TRULICITY) 0.75 MG/0.5ML SOPN See admin instructions. 04/26/20   [provider]  Eszopiclone 3 MG TABS 1 tablet immediately before bedtime 03/30/19   [provider]  levonorgestrel (MIRENA, 52 MG,) 20 MCG/24HR IUD     [provider]  lisinopril-hydrochlorothiazide (ZESTORETIC) 20-12.5 MG tablet 1 tablet 05/25/19   [provider]  prazosin (MINIPRESS) 1 MG capsule Take 1 capsule (1 mg total) by mouth at bedtime. 02/17/21   [name], NP  QUEtiapine (SEROQUEL) 100 MG tablet Take 1 tablet (100 mg total) by mouth at bedtime. 02/14/21 02/14/22  [name], NP  rizatriptan (MAXALT-MLT) 10 MG disintegrating tablet 1 tablet 07/17/19   [provider]  topiramate (TOPAMAX) 25 MG tablet 1 tablet 10/22/19   [provider]      Allergies    Patient has no known allergies.    Review of  Systems   Review of Systems  Gastrointestinal:  Positive for abdominal pain, nausea and vomiting.  All other systems reviewed and are negative.   Physical Exam Updated Vital Signs BP 113/75   Pulse 73   Temp 97.7 F (36.5 C) (Oral)   Resp 16   Ht 5\' 1"  (1.549 m)   Wt 104.3 kg   SpO2 96%   BMI 43.46 kg/m  Physical Exam Vitals and nursing note reviewed.  Constitutional:      Appearance: Normal appearance.  HENT:     Head: Normocephalic and atraumatic.     Right Ear: External ear normal.     Left Ear: External ear normal.     Nose: Nose normal.     Mouth/Throat:     Mouth: Mucous membranes are moist.     Pharynx: Oropharynx is clear.  Eyes:     Extraocular Movements: Extraocular movements intact.     Conjunctiva/sclera: Conjunctivae normal.     Pupils: Pupils are equal, round, and reactive to light.  Cardiovascular:     Rate and Rhythm: Regular rhythm. Tachycardia present.     Pulses: Normal pulses.     Heart sounds: Normal heart sounds.  Pulmonary:     Effort: Pulmonary effort is normal.     Breath sounds: Normal breath sounds.  Abdominal:     General: Abdomen is flat. Bowel sounds are normal.     Palpations: Abdomen is  soft.     Tenderness: There is abdominal tenderness in the epigastric area.  Musculoskeletal:        General: Normal range of motion.     Cervical back: Normal range of motion and neck supple.  Skin:    General: Skin is warm.     Capillary Refill: Capillary refill takes less than 2 seconds.  Neurological:     General: No focal deficit present.     Mental Status: She is alert and oriented to person, place, and time.  Psychiatric:        Mood and Affect: Mood normal.        Behavior: Behavior normal.     ED Results / Procedures / Treatments   Labs (all labs ordered are listed, but only abnormal results are displayed) Labs Reviewed  COMPREHENSIVE METABOLIC PANEL - Abnormal; Notable for the following components:      Result Value   Chloride  97 (*)    Glucose, Bld 147 (*)    Creatinine, Ser 1.14 (*)    Total Protein 8.7 (*)    Total Bilirubin 1.4 (*)    All other components within normal limits  CBC - Abnormal; Notable for the following components:   WBC 10.8 (*)    All other components within normal limits  URINALYSIS, ROUTINE W REFLEX MICROSCOPIC - Abnormal; Notable for the following components:   Bilirubin Urine SMALL (*)    Ketones, ur 80 (*)    Protein, ur 30 (*)    All other components within normal limits  URINALYSIS, MICROSCOPIC (REFLEX) - Abnormal; Notable for the following components:   Bacteria, UA MANY (*)    All other components within normal limits  LIPASE, BLOOD  PREGNANCY, URINE    EKG None  Radiology No results found.  Procedures Procedures    Medications Ordered in ED Medications  sodium chloride 0.9 % bolus 1,000 mL (0 mLs Intravenous Stopped 01/19/23 1323)  ondansetron (ZOFRAN) injection 4 mg (4 mg Intravenous Given 01/19/23 1208)    ED Course/ Medical Decision Making/ A&P                             Medical Decision Making Amount and/or Complexity of Data Reviewed Labs: ordered.  Risk Prescription drug management.   This patient presents to the ED for concern of n/v/d, this involves an extensive number of treatment options, and is a complaint that carries with it a high risk of complications and morbidity.  The differential diagnosis includes ozempic reaction, electrolyte abn, infection   Co morbidities that complicate the patient evaluation  anxiety, htn, depression, adhd, ptsd, bipolar d/o, and dm2   Additional history obtained:  Additional history obtained from epic chart review  Lab Tests:  I Ordered, and personally interpreted labs.  The pertinent results include:  ua + ketones, protein; no uti + contamination; cbc with wbc slightly elevated at 10.8, cmp with glucose 147 and cr 1.14; preg neg,    Cardiac Monitoring:  The patient was maintained on a cardiac  monitor.  I personally viewed and interpreted the cardiac monitored which showed an underlying rhythm of: st initially; now nsr   Medicines ordered and prescription drug management:  I ordered medication including zofran and ivfs  for sx  Reevaluation of the patient after these medicines showed that the patient improved I have reviewed the patients home medicines and have made adjustments as needed  Critical Interventions:  IVFs/zofran  Problem List / ED Course:  N/v/d:  improved after ivfs.  Pt is able to tolerate po fluids.  Pt is stable for d/c.  Return if worse.    Reevaluation:  After the interventions noted above, I reevaluated the patient and found that they have :improved   Social Determinants of Health:  Lives at home   Dispostion:  After consideration of the diagnostic results and the patients response to treatment, I feel that the patent would benefit from discharge with outpatient f/u.          Final Clinical Impression(s) / ED Diagnoses Final diagnoses:  Dehydration  Nausea vomiting and diarrhea    Rx / DC Orders ED Discharge Orders          Ordered    ondansetron (ZOFRAN-ODT) 4 MG disintegrating tablet  Every 8 hours PRN        01/19/23 1359              Jacalyn Lefevre, MD 01/19/23 1409

## 2023-11-20 ENCOUNTER — Ambulatory Visit
Admission: EM | Admit: 2023-11-20 | Discharge: 2023-11-20 | Disposition: A | Discharge status: Home / Self Care | Attending: Physician Assistant | Admitting: Physician Assistant

## 2023-11-20 ENCOUNTER — Other Ambulatory Visit: Payer: Self-pay

## 2023-11-20 DIAGNOSIS — R220 Localized swelling, mass and lump, head: Secondary | ICD-10-CM

## 2023-11-20 MED ORDER — DOXYCYCLINE HYCLATE 100 MG PO CAPS: 100.0000 mg | ORAL_CAPSULE | Freq: Two times a day (BID) | ORAL | 0 refills | Status: AC

## 2023-11-20 NOTE — ED Triage Notes (Signed)
 Pt presents with complaints of right eye irritation & drainage x 1 day. Redness noted around the right eye. Pt currently rates her overall pain a 2/10. OTC Zyrtec taken & OTC allergy eye drops applied with no relief.

## 2023-11-20 NOTE — ED Provider Notes (Signed)
 [name]    CSN: [id number] Arrival date & time: 11/20/23  1748      History   Chief Complaint Chief Complaint  Patient presents with   Eye Problem    HPI Savannah [name] is a 38 y.o. female.   Patient here today for evaluation of irritation to her face lateral to her right eye.  She reports she has had some right eye drainage as well.  She has redness to the skin of this area but nothing to the eye itself.  She has mild discomfort with a pain rating of 2 out of 10.  She has tried over-the-counter Zyrtec and over-the-counter allergy eyedrops without significant improvement.  She denies any fever, nausea or vomiting.  She has not had any vision changes.  There is no pain with extraocular movements.  The history is provided by the patient.    Past Medical History:  Diagnosis Date   ADHD    Anxiety    Bipolar disorder (HCC)    Depression    Diabetes mellitus, type II (HCC)    Headache    Hypertension    Insomnia    PTSD (post-traumatic stress disorder)     There are no active problems to display for this patient.   Past Surgical History:  Procedure Laterality Date   TONSILLECTOMY Bilateral 2021    OB History   No obstetric history on file.      Home Medications    Prior to Admission medications   Medication Sig Start Date End Date Taking? Authorizing Provider  doxycycline (VIBRAMYCIN) 100 MG capsule Take 1 capsule (100 mg total) by mouth 2 (two) times daily. 11/20/23  Yes Tomi Bamberger, PA-C  ALPRAZolam Prudy Feeler) 0.5 MG tablet 1 tablet 03/30/19   [provider]  amphetamine-dextroamphetamine (ADDERALL XR) 20 MG 24 hr capsule 10 mg. 03/04/20   [provider]  ARIPiprazole (ABILIFY) 5 MG tablet Take 5 mg by mouth daily.    [provider]  Dulaglutide (TRULICITY) 0.75 MG/0.5ML SOPN See admin instructions. 04/26/20   [provider]  Eszopiclone 3 MG TABS 1 tablet immediately before bedtime 03/30/19   [provider]  levonorgestrel (MIRENA, 52 MG,) 20 MCG/24HR IUD     [provider]  lisinopril-hydrochlorothiazide (ZESTORETIC) 20-12.5 MG tablet 1 tablet 05/25/19   [provider]  ondansetron (ZOFRAN-ODT) 4 MG disintegrating tablet Take 1 tablet (4 mg total) by mouth every 8 (eight) hours as needed for nausea or vomiting. 01/19/23   Jacalyn Lefevre, MD  prazosin (MINIPRESS) 1 MG capsule Take 1 capsule (1 mg total) by mouth at bedtime. 02/17/21   [name], NP  QUEtiapine (SEROQUEL) 100 MG tablet Take 1 tablet (100 mg total) by mouth at bedtime. 02/14/21 02/14/22  [name], NP  rizatriptan (MAXALT-MLT) 10 MG disintegrating tablet 1 tablet 07/17/19   [provider]  topiramate (TOPAMAX) 25 MG tablet 1 tablet 10/22/19   [provider]    Family History History reviewed. No pertinent family history.  Social History Social History   Tobacco Use   Smoking status: Never   Smokeless tobacco: Never  Vaping Use   Vaping status: Never Used  Substance Use Topics   Alcohol use: Yes    Comment: weekly   Drug use: Yes    Types: Marijuana     Allergies   Bee venom, Metformin, and Nickel   Review of Systems Review of Systems  Constitutional:  Negative for chills and fever.  HENT:  Positive for facial swelling.   Eyes:  Negative for discharge and redness.  Respiratory:  Negative for shortness of breath.   Gastrointestinal:  Negative for abdominal pain, nausea and vomiting.     Physical Exam Triage Vital Signs ED Triage Vitals  Encounter Vitals Group     BP      Systolic BP Percentile      Diastolic BP Percentile      Pulse      Resp      Temp      Temp src      SpO2      Weight      Height      Head Circumference      Peak Flow      Pain Score      Pain Loc      Pain Education      Exclude from Growth Chart    No data found.  Updated Vital Signs BP 134/89 (BP Location: Right Arm)   Pulse 89   Temp 98 F (36.7 C)  (Oral)   Resp 16   Ht 5\' 1"  (1.549 m)   Wt 235 lb (106.6 kg)   SpO2 98%   BMI 44.40 kg/m   Visual Acuity Right Eye Distance:   Left Eye Distance:   Bilateral Distance:    Right Eye Near:   Left Eye Near:    Bilateral Near:     Physical Exam Vitals and nursing note reviewed.  Constitutional:      General: She is not in acute distress.    Appearance: Normal appearance. She is not ill-appearing.  HENT:     Head: Normocephalic and atraumatic.     Comments: Mild swelling to face lateral and posterior to the right eye, associated erythema noted    Nose: Nose normal. No congestion or rhinorrhea.  Eyes:     Extraocular Movements: Extraocular movements intact.     Conjunctiva/sclera: Conjunctivae normal.     Pupils: Pupils are equal, round, and reactive to light.  Cardiovascular:     Rate and Rhythm: Normal rate.  Pulmonary:     Effort: Pulmonary effort is normal.  Neurological:     Mental Status: She is alert.  Psychiatric:        Mood and Affect: Mood normal.        Behavior: Behavior normal.        Thought Content: Thought content normal.      UC Treatments / Results  Labs (all labs ordered are listed, but only abnormal results are displayed) Labs Reviewed - No data to display  EKG   Radiology No results found.  Procedures Procedures (including critical care time)  Medications Ordered in UC Medications - No data to display  Initial Impression / Assessment and Plan / UC Course  I have reviewed the triage vital signs and the nursing notes.  Pertinent labs & imaging results that were available during my care of the patient were reviewed by me and considered in my medical decision making (see chart for details).    Will treat with antibiotic to cover possible infection.  No concerns at this point for periorbital cellulitis or preseptal cellulitis but did discuss follow-up in the emergency room if no improvement with antibiotics or if she has any worsening  symptoms as she may need imaging at that time.  Patient expresses understanding.  Final Clinical Impressions(s) / UC Diagnoses   Final diagnoses:  Facial swelling   Discharge  Instructions   None    ED Prescriptions     Medication Sig Dispense Auth. Provider   doxycycline (VIBRAMYCIN) 100 MG capsule Take 1 capsule (100 mg total) by mouth 2 (two) times daily. 20 capsule Tomi Bamberger, PA-C      PDMP not reviewed this encounter.   Tomi Bamberger, PA-C 11/20/23 [place] Fruit
